# Patient Record
Sex: Female | Born: 2000 | Race: Asian | Hispanic: No | Marital: Single | Smoking: Never smoker
Health system: Southern US, Community
[De-identification: ages and names within clinical notes are randomized; demographics above are authoritative.]

## PROBLEM LIST (undated history)

## (undated) DIAGNOSIS — J45909 Unspecified asthma, uncomplicated: Secondary | ICD-10-CM

## (undated) DIAGNOSIS — E049 Nontoxic goiter, unspecified: Secondary | ICD-10-CM

## (undated) DIAGNOSIS — L309 Dermatitis, unspecified: Secondary | ICD-10-CM

## (undated) DIAGNOSIS — R6250 Unspecified lack of expected normal physiological development in childhood: Secondary | ICD-10-CM

## (undated) DIAGNOSIS — E063 Autoimmune thyroiditis: Secondary | ICD-10-CM

## (undated) DIAGNOSIS — E3431 Constitutional short stature: Secondary | ICD-10-CM

## (undated) HISTORY — DX: Nontoxic goiter, unspecified: E04.9

## (undated) HISTORY — PX: NO PAST SURGERIES: SHX2092

## (undated) HISTORY — DX: Autoimmune thyroiditis: E06.3

## (undated) HISTORY — DX: Dermatitis, unspecified: L30.9

## (undated) HISTORY — DX: Unspecified asthma, uncomplicated: J45.909

## (undated) HISTORY — DX: Unspecified lack of expected normal physiological development in childhood: R62.50

## (undated) HISTORY — DX: Constitutional short stature: E34.31

---
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
## 2019-06-17 ENCOUNTER — Other Ambulatory Visit: Payer: Self-pay

## 2019-06-17 DIAGNOSIS — Z20822 Contact with and (suspected) exposure to covid-19: Secondary | ICD-10-CM

## 2019-06-18 LAB — NOVEL CORONAVIRUS, NAA: SARS-CoV-2, NAA: NOT DETECTED

## 2019-06-30 ENCOUNTER — Ambulatory Visit
Admission: RE | Admit: 2019-06-30 | Discharge: 2019-06-30 | Disposition: A | Discharge status: Home / Self Care | Payer: BC Managed Care – PPO | Source: Ambulatory Visit | Attending: Allergy and Immunology | Admitting: Allergy and Immunology

## 2019-06-30 ENCOUNTER — Other Ambulatory Visit: Payer: Self-pay | Admitting: Allergy and Immunology

## 2019-06-30 DIAGNOSIS — J45909 Unspecified asthma, uncomplicated: Secondary | ICD-10-CM

## 2019-07-03 ENCOUNTER — Other Ambulatory Visit: Payer: Self-pay | Admitting: Family Medicine

## 2019-07-03 DIAGNOSIS — K5909 Other constipation: Secondary | ICD-10-CM

## 2019-07-03 DIAGNOSIS — R109 Unspecified abdominal pain: Secondary | ICD-10-CM

## 2019-07-03 DIAGNOSIS — E611 Iron deficiency: Secondary | ICD-10-CM

## 2020-06-23 ENCOUNTER — Ambulatory Visit
Admission: RE | Admit: 2020-06-23 | Discharge: 2020-06-23 | Disposition: A | Discharge status: Home / Self Care | Payer: BC Managed Care – PPO | Source: Ambulatory Visit | Attending: Family Medicine | Admitting: Family Medicine

## 2020-06-23 ENCOUNTER — Other Ambulatory Visit: Payer: Self-pay | Admitting: Family Medicine

## 2020-06-23 DIAGNOSIS — N390 Urinary tract infection, site not specified: Secondary | ICD-10-CM

## 2021-10-15 ENCOUNTER — Emergency Department
Admission: EM | Admit: 2021-10-15 | Discharge: 2021-10-15 | Disposition: A | Discharge status: Home / Self Care | Payer: BC Managed Care – PPO | Attending: Emergency Medicine | Admitting: Emergency Medicine

## 2021-10-15 DIAGNOSIS — Y906 Blood alcohol level of 120-199 mg/100 ml: Secondary | ICD-10-CM | POA: Insufficient documentation

## 2021-10-15 DIAGNOSIS — F10920 Alcohol use, unspecified with intoxication, uncomplicated: Secondary | ICD-10-CM | POA: Diagnosis present

## 2021-10-15 LAB — CBC, PLATELET & DIFFERENTIAL
ABSOLUTE BASO COUNT: 0 10*3/uL (ref 0.0–0.1)
ABSOLUTE EOSINOPHIL COUNT: 0.2 10*3/uL (ref 0.0–0.8)
ABSOLUTE IMM GRAN COUNT: 0.03 10*3/uL (ref 0.00–0.10)
ABSOLUTE LYMPH COUNT: 3.6 10*3/uL (ref 0.6–5.9)
ABSOLUTE MONO COUNT: 0.4 10*3/uL (ref 0.2–1.4)
ABSOLUTE NEUTROPHIL COUNT: 3.5 10*3/uL (ref 1.6–8.3)
ABSOLUTE NRBC COUNT: 0 10*3/uL (ref 0.0–0.0)
BASOPHIL %: 0.5 % (ref 0.0–1.2)
EOSINOPHIL %: 1.9 % (ref 0.0–7.0)
HEMATOCRIT: 44.7 % (ref 34.1–44.9)
HEMOGLOBIN: 14.7 g/dL (ref 11.2–15.7)
IMMATURE GRANULOCYTE %: 0.4 % (ref 0.0–1.0)
LYMPHOCYTE %: 46.4 % (ref 15.0–54.0)
MEAN CORP HGB CONC: 32.9 g/dL (ref 31.0–37.0)
MEAN CORPUSCULAR HGB: 27.7 pg (ref 26.0–34.0)
MEAN CORPUSCULAR VOL: 84.3 fl (ref 80.0–100.0)
MEAN PLATELET VOLUME: 9.5 fL (ref 8.7–12.5)
MONOCYTE %: 5.2 % (ref 4.0–13.0)
NEUTROPHIL %: 45.6 % (ref 40.0–75.0)
NRBC %: 0 % (ref 0.0–0.0)
PLATELET COUNT: 416 10*3/uL — ABNORMAL HIGH (ref 150–400)
RBC DISTRIBUTION WIDTH STD DEV: 35.8 fL (ref 35.1–46.3)
RED BLOOD CELL COUNT: 5.3 M/uL — ABNORMAL HIGH (ref 3.90–5.20)
WHITE BLOOD CELL COUNT: 7.7 10*3/uL (ref 4.0–11.0)

## 2021-10-15 LAB — BASIC METABOLIC PANEL
ANION GAP: 19 mmol/L (ref 10–22)
BUN (UREA NITROGEN): 4 mg/dL — ABNORMAL LOW (ref 7–18)
CALCIUM: 9.5 mg/dL (ref 8.5–10.5)
CARBON DIOXIDE: 19 mmol/L — ABNORMAL LOW (ref 21–32)
CHLORIDE: 102 mmol/L (ref 98–107)
CREATININE: 0.6 mg/dL (ref 0.4–1.2)
ESTIMATED GLOMERULAR FILT RATE: >60 mL/min (ref >60)
Glucose Random: 118 mg/dL (ref 74–160)
POTASSIUM: 4.3 mmol/L (ref 3.5–5.1)
SODIUM: 140 mmol/L (ref 136–145)

## 2021-10-15 LAB — BLOOD SUGAR FINGERSTICK (POINT OF CARE): FINGERSTICK GLUCOSE: 102 mg/dl (ref 74–160)

## 2021-10-15 LAB — SERUM DRUG SCREEN
ACETAMINOPHEN: <5 ug/mL (ref 10–30)
ETHANOL: 146 mg/dL — ABNORMAL HIGH (ref 0–10)
SALICYLATE: <0.5 mg/dL (ref 3.0–20.0)

## 2021-10-15 MED ORDER — SODIUM CHLORIDE 0.9 % IV BOLUS
1000.0000 mL | Freq: Once | INTRAVENOUS | Status: AC
  Administered 2021-10-15: 1000 mL via INTRAVENOUS

## 2021-10-15 MED ORDER — ONDANSETRON HCL 4 MG/2ML IJ SOLN
4.00 mg | Freq: Once | INTRAMUSCULAR | Status: AC
Start: 2021-10-15 — End: 2021-10-15
  Administered 2021-10-15: 4 mg via INTRAVENOUS
  Filled 2021-10-15: qty 2

## 2021-10-15 NOTE — ED Provider Notes (Signed)
The patient was seen primarily by me. ED nursing record was reviewed. Select prior records as available electronically through the Epic record were reviewed.     HPI:    Shelby Ferrell is a 20 year old female patient who presented acutely intoxicated from the community.  Per report, her friends called because she was intoxicated and was nauseous.  The patient vomiting upon presentation.  No fever, no chills.  No abdominal pain.  No diarrhea.  No chest pain, cough or shortness of breath.  Patient reports that she does not drink often, and reports that she drank 4 drinks tonight.  She denies recreational drug use. No fall.     Triage Documentation     Evette Georges, RN 10/15/2021 01:12             Pt BIB[facility] ETOH from [facility]. Pt sleeping, chest rise and fall noted, pt vss. Friends called bc pt was etoh, mixed drinks.            ROS: Pertinent positives were reviewed as per the HPI above. All other systems were reviewed and are negative.  Magnus Ivan  Language of care: Daisy Floro  MRN: [id number]  PCP: No primary care provider on file.  Mode of arrival to ED: Ambulance Pro.  Chief complaint: Alcohol Problem    Past Medical History/Problem list:  No past medical history on file.  There is no problem list on file for this patient.    Past Surgical History: No past surgical history on file.  Social History:   Social History     Socioeconomic History    Marital status: Not on file     Spouse name: Not on file    Number of children: Not on file    Years of education: Not on file    Highest education level: Not on file   Occupational History    Not on file   Tobacco Use    Smoking status: Not on file    Smokeless tobacco: Not on file   Substance and Sexual Activity    Alcohol use: Not on file    Drug use: Not on file    Sexual activity: Not on file   Other Topics Concern    Not on file   Social History Narrative    Not on file   Social Determinants of Health  Financial Resource Strain: Not on file  Food  Insecurity: Not on file  Transportation Needs: Not on file  Physical Activity: Not on file  Stress: Not on file  Social Connections: Not on file  Intimate Partner Violence: Not on file  Housing Stability: Not on file     Allergies: Review of Patient's Allergies indicates:  Not on File    Immunizations:   There is no immunization history on file for this patient.       Medications:  None     Physical Exam (ED Bed HAL 08/HAL 08-A):   Patient Vitals for the past 999 hrs:   BP Temp Pulse Resp SpO2   10/15/21 0109 95/61 97.7 F 75 16 98 %     GENERAL:  WDWN, no acute distress, non-toxic   SKIN:  Warm & Dry, no rash, no petechiae or purpurae.  HEAD:  NCAT. Sclerae are anicteric and aninjected, oropharynx is clear with moist mucous membranes. PERRL.  NECK:  Supple, no LAN.  LUNGS:  Clear to auscultation bilaterally. No wheezes, rales, rhonchi.   HEART:  RRR.  No murmurs, rubs,  or gallops.   ABDOMEN:  Soft, NTND.  No involuntary guarding or rebound.   EXTREMITIES:  No obvious deformities.  GENITOURINARY:  No CVA tenderness B.  NEUROLOGIC: Slurred speech, moving all extremity, following simple commands  PSYCHIATRIC: Intoxicated affect    Medications Given in the ED:    Medications   ondansetron (ZOFRAN) injection 4 mg (4 mg Intravenous Given 10/15/21 0148)   sodium chloride 0.9 % IV bolus 1,000 mL (0 mLs Intravenous Stopped 10/15/21 0425)    Radiology Results:  See ED COURSE   Lab Results:     Labs Reviewed   CBC, PLATELET & DIFFERENTIAL - Abnormal; Notable for the following components:       Result Value    RED BLOOD CELL COUNT 5.30 (*)     PLATELET COUNT 416 (*)     All other components within normal limits   BASIC METABOLIC PANEL - Abnormal; Notable for the following components:    CARBON DIOXIDE 19 (*)     BUN (UREA NITROGEN) 4 (*)     All other components within normal limits    Narrative:     Tests added: SDS by [facility] on 10/15/21 at 0227 by PV12.   SERUM DRUG SCREEN - Abnormal; Notable for the following components:     ETHANOL 146 (*)     All other components within normal limits    Narrative:     Tests added: SDS by [facility] on 10/15/21 at 0227 by PV12.   LAB ADD ON/WRITE IN TEST   BLOOD SUGAR FINGERSTICK (POINT OF CARE)   SERUM DRUG SCREEN        No image results found.      Other Results and OLD/PRIOR records information and data (e.g. ECG, visual acuity):  See ED COURSE     ED Course and Medical Decision-making:  20 year old female presenting acutely intoxicated from the community.  Upon presentation, patient is actively vomiting and appears intoxicated.  Fingerstick blood sugar within normal limit.  Due to patient still currently vomiting, will administer IV fluid, Zofran.  Will order basic blood work, monitor and reassess    ED Course as of 10/17/21 2115   Sat Oct 15, 2021   0539 Awake and alert. Tolerating p.o.  Will discharge.  Patient will call an Uber home   [phone] Resting, no distress   0304 Resting, no distress   0254 Etoh 146  BMP with no AKI.  No anemia.  Normal white cell count     Patient was observed for several hours with improvement of mental status, now awake and alert.  The patient is tolerating p.o.  Advised to rest, drink plenty of fluid, and follow-up with PCP in a few days.  Patient will call a uber home    Patient educated on diagnosis(es); she states understanding and agreement with plan of care.  Reasons to return to the ED were reviewed in detail. She agrees with this plan and disposition.    Disposition: Discharge    Condition on Discharge:  Stable    Diagnosis/Diagnoses:  Alcoholic intoxication without complication ([place])    Maude Leriche, MD MPH  Attending physician  Emergency [place]    This Emergency Department patient encounter note was created using voice-recognition software and in real time during the ED visit.

## 2021-10-15 NOTE — ED Triage Note (Signed)
Pt BIB[facility] ETOH from [facility]. Pt sleeping, chest rise and fall noted, pt vss. Friends called bc pt was etoh, mixed drinks.

## 2021-10-15 NOTE — Discharge Instructions (Addendum)
You were seen in the emergency department today after you were intoxicated.  Your alcohol level was 146  You received IV fluid and Zofran.  Drink plenty of fluid today  Rest  Follow-up with your doctor next week  Come back to the emergency department if you do not feel well, if you cannot keep liquids or food down, or in case of any new or worrisome symptoms

## 2021-10-15 NOTE — Narrator Note (Signed)
Patient Disposition  Patient education for diagnosis, medications, activity, diet and follow-up.  Patient left ED 6:15 AM.  Patient rep received written instructions.    Interpreter to provide instructions: No    Patient belongings with patient: YES    Have all existing LDAs been addressed? Yes    Have all IV infusions been stopped? yes    Destination: Discharged to home, instructions reviewed, verbal understanding, pt left with steady gait

## 2021-10-15 NOTE — ED Notes (Addendum)
Shelby Ferrell - Shelby Ferrell     [phone]

## 2022-01-31 IMAGING — CT CT ABD-PELV W/O CM
2 of 4 series · 12 of 46 positions shown, 14 images · non-contrast
Comparison: None.

CLINICAL DATA: Recurrent urinary tract infections. Urinary
frequency.

EXAM:
CT ABDOMEN AND PELVIS WITHOUT CONTRAST
TECHNIQUE: Multidetector CT imaging of the abdomen and pelvis was performed
following the standard protocol without IV contrast.

[Series 2: renal stone 5.00 br40 s3 axial · axial · 0.44mm/px · z∈[+1322,+1657]mm · 9 of 81 slices shown, 11 images]
[im 7/81  soft-tissue]
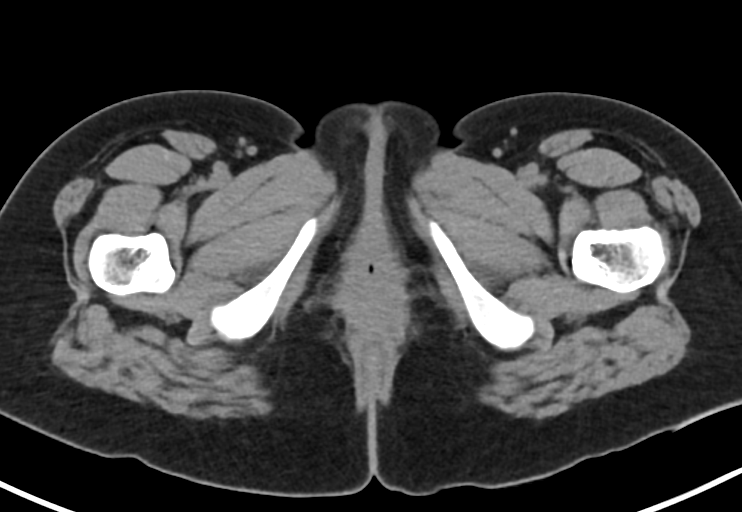
[im 7/81  bone]
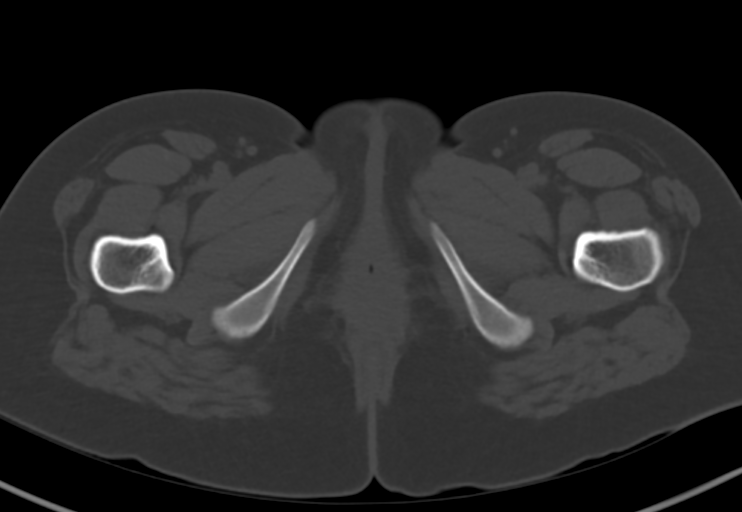
[im 14/81  soft-tissue]
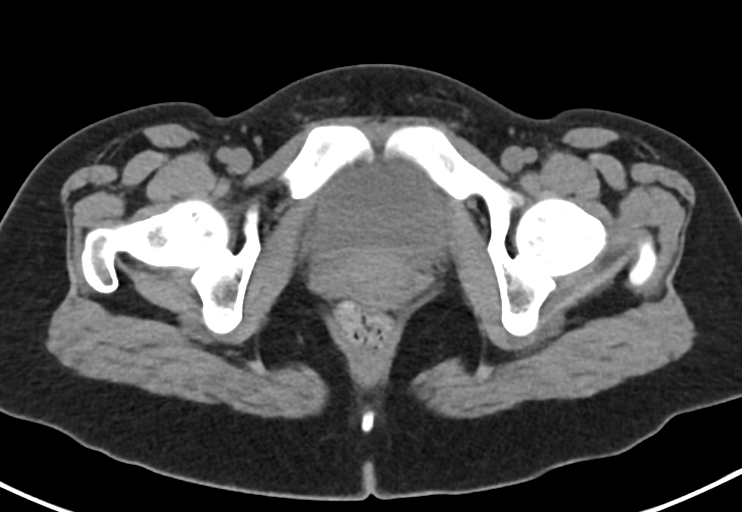
[im 24/81  soft-tissue]
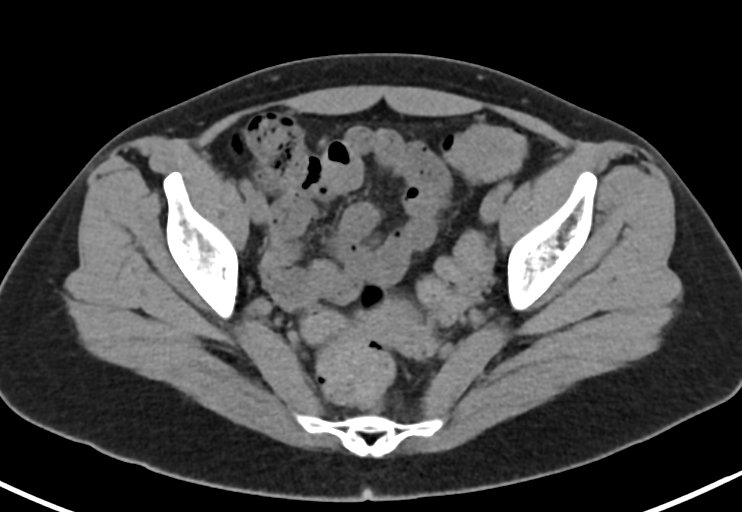
[im 31/81  soft-tissue]
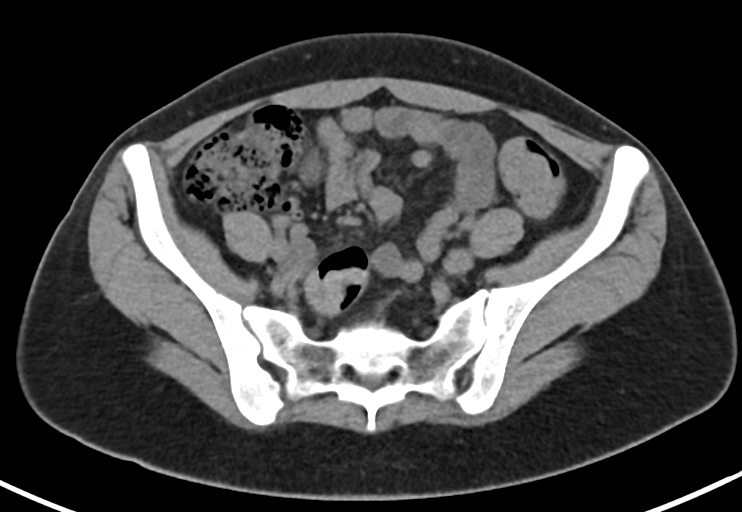
[im 41/81  soft-tissue]
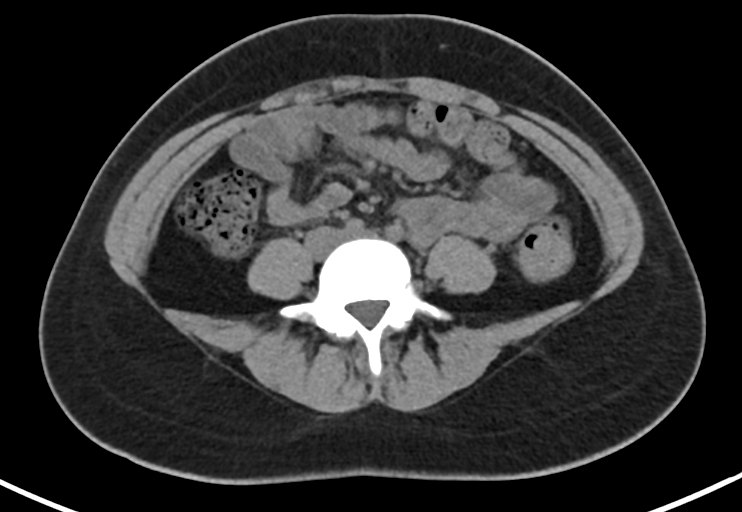
[im 51/81  soft-tissue]
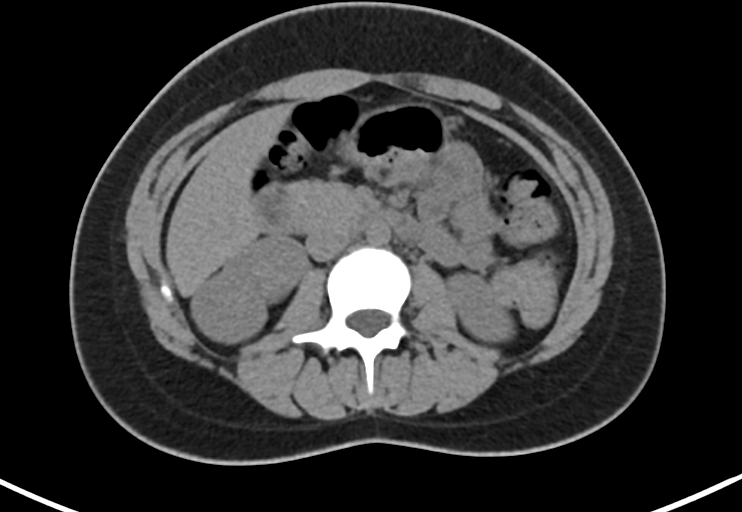
[im 57/81  soft-tissue]
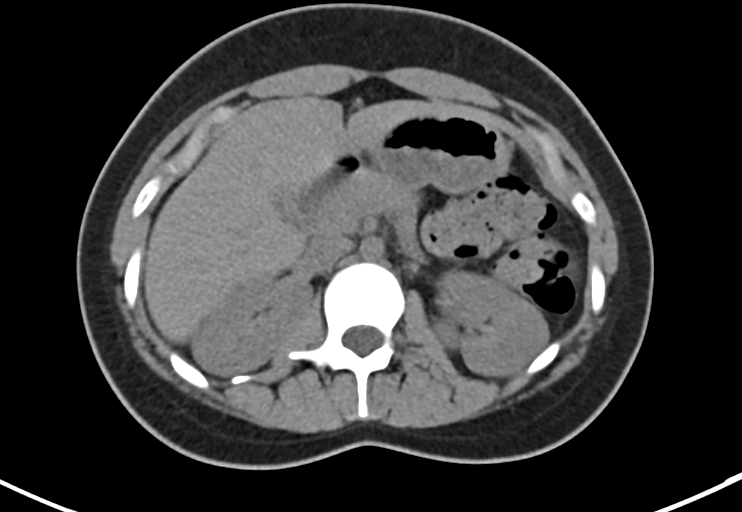
[im 67/81  soft-tissue]
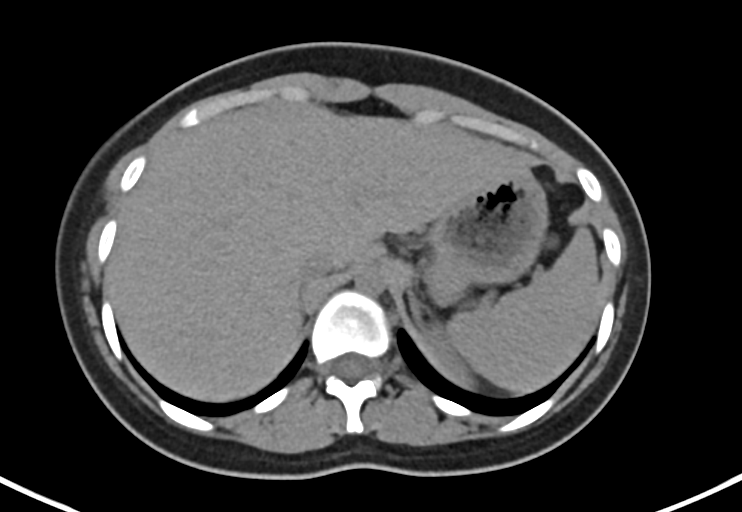
[im 74/81  soft-tissue]
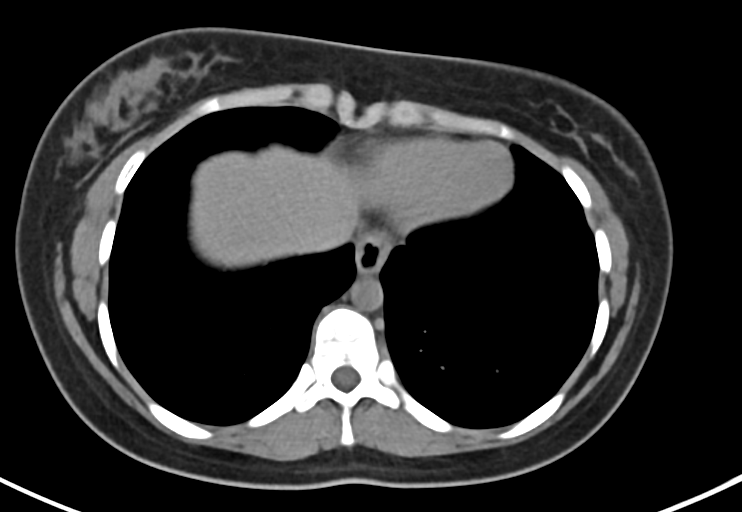
[im 74/81  bone]
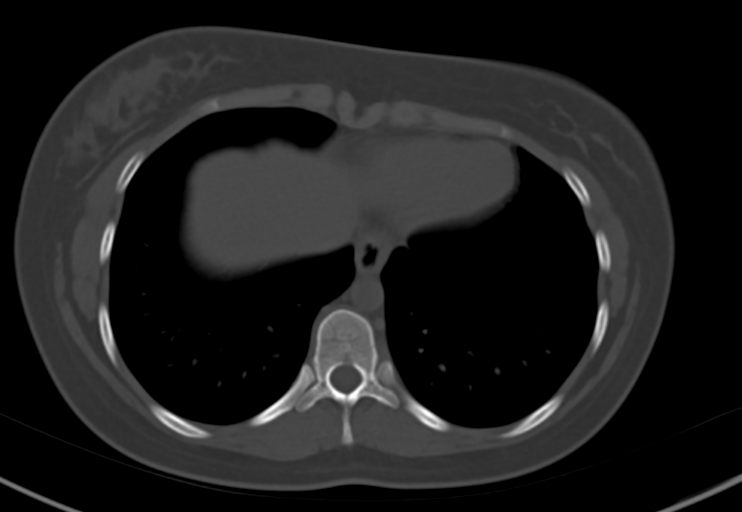

[Series 6: renal stone 2.00 br40 s3 cor · coronal · 0.64mm/px · 3 of 113 slices shown]
[im 38/113  soft-tissue]
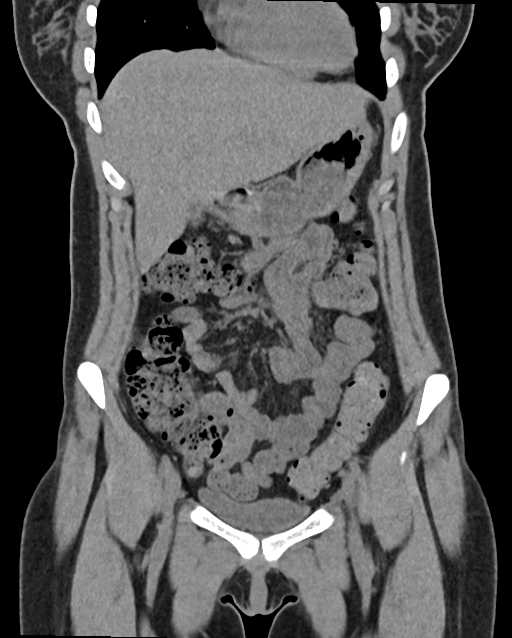
[im 50/113  soft-tissue]
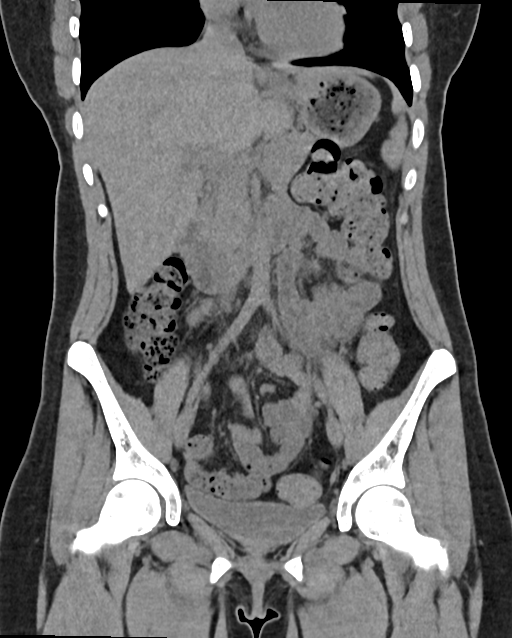
[im 63/113  soft-tissue]
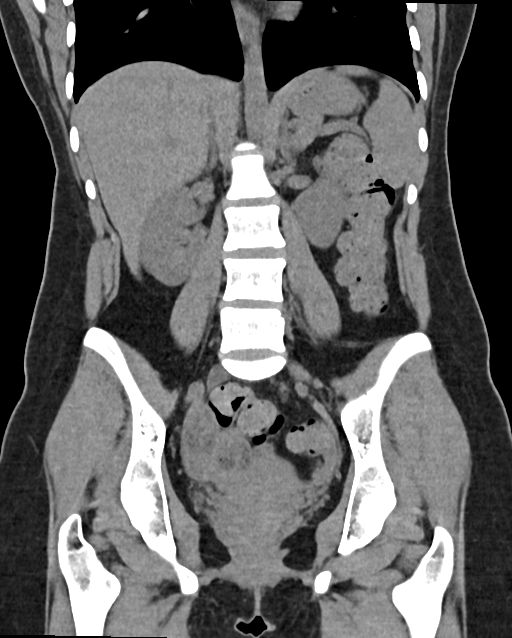

[12 of 46 positions shown; findings below may reference images not displayed]

FINDINGS: Lower chest: No acute findings.

Hepatobiliary: No mass visualized on this unenhanced exam. Focal
fatty infiltration seen adjacent to the falciform ligament.
Gallbladder is nearly empty. No evidence of biliary obstruction.

Pancreas: No mass or inflammatory process visualized on this
unenhanced exam.

Spleen:  Within normal limits in size.

Adrenals/Urinary tract: No evidence of urolithiasis or
hydronephrosis. Unremarkable unopacified urinary bladder.

Stomach/Bowel: No evidence of obstruction, inflammatory process, or
abnormal fluid collections. Normal appendix visualized.

Vascular/Lymphatic: No pathologically enlarged lymph nodes
identified. No evidence of abdominal aortic aneurysm.

Reproductive:  No mass or other significant abnormality.

Other:  None.

Musculoskeletal:  No suspicious bone lesions identified.
IMPRESSION: No evidence of urolithiasis, hydronephrosis, or other acute
findings.

## 2022-09-05 ENCOUNTER — Ambulatory Visit: Payer: BC Managed Care – PPO | Admitting: Family Medicine

## 2022-09-05 ENCOUNTER — Ambulatory Visit (INDEPENDENT_AMBULATORY_CARE_PROVIDER_SITE_OTHER)
Admission: RE | Admit: 2022-09-05 | Discharge: 2022-09-05 | Disposition: A | Discharge status: Home / Self Care | Payer: BC Managed Care – PPO | Source: Ambulatory Visit | Attending: Family Medicine | Admitting: Family Medicine

## 2022-09-05 ENCOUNTER — Encounter: Payer: Self-pay | Admitting: Family Medicine

## 2022-09-05 VITALS — BP 116/80 | HR 82 | Temp 98.7°F | Ht <= 58 in | Wt 109.0 lb

## 2022-09-05 DIAGNOSIS — R1013 Epigastric pain: Secondary | ICD-10-CM

## 2022-09-05 DIAGNOSIS — R0789 Other chest pain: Secondary | ICD-10-CM

## 2022-09-05 LAB — H. PYLORI ANTIBODY, IGG: H Pylori IgG: NEGATIVE

## 2022-09-05 LAB — AMYLASE: Amylase: 100 U/L (ref 27–131)

## 2022-09-05 LAB — LIPASE: Lipase: 12 U/L (ref 11.0–59.0)

## 2022-09-05 MED ORDER — OMEPRAZOLE 40 MG PO CPDR: 40.0000 mg | DELAYED_RELEASE_CAPSULE | Freq: Every day | ORAL | 3 refills | Status: DC

## 2022-09-05 NOTE — Progress Notes (Signed)
New Patient Office Visit  Subjective:  Patient ID: Tracy Hardin, female    DOB: 2001-09-29  Age: 21 y.o. MRN: [id number]  CC:  Chief Complaint  Patient presents with   Establish Care    Here for a second opinion on chest discomfort that has been going on for 2 months without relief     HPI Tanayah Squitieri presents for new pt Has PCP.  Not sure if xfer care but wants another opinion on cp for 2 mo. 1  Asthma-seasonally.  And EIA.  No flovent since Spring.   2.  About 1-2 months ago.  Some CP to touch. Near sternum.  Saw [facility] physician.  Costochondritis and Aleve and no change, then muscle relaxor and no change.  Then told anxiety.  Ant chest to LUQ and L shoulder.  Not in back.  Occ collar bone.  No injury.  Always there but can get worse-no specific movement.  Hard to sleep.  No insp pain/no sob. No f/c.  Walking doesn't make worse  EKG-normal per pt.   Will walk, get groceries, etc and has to nap after.  Past 3-4 mo. Labs 3-4 wks ago.  Vit D and B12 low-taking otc B12 and Rx D.    Had covid 2 yrs ago.     H/o bulemia for 7-8 yrs.  No vomiting for2 mo  was once/mo 2 mo prior.  Was daily prior to that or occ more.  Occ heartburn.  Wt stable.  Stress makes worse.   No chest x-ray     Saw ortho sev mo ago for neck pain/shoulder pain.  Did PT.    Past Medical History:  Diagnosis Date   Asthma    Constitutional growth delay    Goiter    Hashimoto's thyroiditis     Past Surgical History:  Procedure Laterality Date   NO PAST SURGERIES      Family History  Problem Relation Age of Onset   Hypertension Mother    Diabetes Mother        type 2   Crohn's disease Mother    Thyroid disease Maternal Aunt    Hypertension Maternal Grandmother    Diabetes Maternal Grandmother        type 2    Social History   Socioeconomic History   Marital status: Single    Spouse name: Not on file   Number of children: 0   Years of education: Not on file   Highest education level: Not  on file  Occupational History   Not on file  Tobacco Use   Smoking status: Never   Smokeless tobacco: Never  Vaping Use   Vaping Use: Never used  Substance and Sexual Activity   Alcohol use: Yes   Drug use: Never   Sexual activity: Yes  Other Topics Concern   Not on file  Social History Narrative   Will go to grad school for computer science.   Taking semester off.   Social Determinants of Health   Financial Resource Strain: Not on file  Food Insecurity: Not on file  Transportation Needs: Not on file  Physical Activity: Not on file  Stress: Not on file  Social Connections: Not on file  Intimate Partner Violence: Not on file    ROS  ROS: Gen: no fever, chills  Skin: eczema ENT: no ear pain, ear drainage, nasal congestion, rhinorrhea, sinus pressure, sore throat Eyes: no blurry vision, double vision Resp: no cough, wheeze,SOB CV: no, palpitations, LE  edema,  GU: no dysuria, urgency, frequency, hematuria.  Not SA   stopped ocp in summer.  1st period in Oct    Objective:   Today's Vitals: BP 116/80   Pulse 82   Temp 98.7 F (37.1 C) (Temporal)   Ht 4' 5.39" (1.356 m)   Wt 109 lb (49.4 kg)   LMP 08/28/2022 (Approximate)   SpO2 99%   BMI 26.88 kg/m   Physical Exam  Gen: WDWN NAD HEENT: NCAT, conjunctiva not injected, sclera nonicteric , OP moist, no exudates  NECK:  supple, no thyromegaly, no nodes, no carotid bruits CARDIAC: RRR, S1S2+, no murmur. DP 2+B LUNGS: CTAB. No wheezes ABDOMEN:  BS+, soft, mildly tender mid epi, No HSM, no masses EXT:  no edema MSK: no gross abnormalities. No TTP c spine.  Some muscle tenderness medial border L scapula.  Tender  NEURO: A&O x3.  CN II-XII intact.  PSYCH: normal mood. Good eye contact   Assessment & Plan:   Problem List Items Addressed This Visit   None Visit Diagnoses     Other chest pain    -  Primary   Relevant Orders   DG Chest 2 View   Midepigastric pain       Relevant Orders   Amylase   Lipase    H. pylori antibody, IgG      Chest wall pain-reviewed labs on phone.  Crp,esr,tsh,cmp,cbc all wnl.   Will check cxr.  ?d/t long h/o vomiting.  Will start omeprazole 74m daily and f/u 1 mo Midepi pain-check h pylori, amylase/lipase.  Start omeprazole 412mdaily.  F/u 1 mo  Outpatient Encounter Medications as of 09/05/2022  Medication Sig   cyclobenzaprine (FLEXERIL) 10 MG tablet Take 10 mg by mouth 2 (two) times daily as needed.   fluticasone (FLOVENT HFA) 110 MCG/ACT inhaler Inhale 1 puff into the lungs as needed.   loteprednol (LOTEMAX) 0.5 % ophthalmic suspension 1 drop as needed.   omeprazole (PRILOSEC) 40 MG capsule Take 1 capsule (40 mg total) by mouth daily.   triamcinolone cream (KENALOG) 0.5 % APPLY TO AFFECTED AREA(S) TWICE DAILY AS NEEDED   Vitamin D, Ergocalciferol, (DRISDOL) 1.25 MG (50000 UNIT) CAPS capsule Take 50,000 Units by mouth every 7 (seven) days.   [DISCONTINUED] albuterol (PROVENTIL) (2.5 MG/3ML) 0.083% nebulizer solution Take 2.5 mg by nebulization every 6 (six) hours as needed.   [DISCONTINUED] Cetirizine HCl (ZYRTEC PO) Take by mouth as needed.     No facility-administered encounter medications on file as of 09/05/2022.    Follow-up: Return in about 4 weeks (around 10/03/2022) for chest pain.   AnWellington HampshireMD

## 2022-09-05 NOTE — Patient Instructions (Signed)
Welcome to [facility]! It was a pleasure meeting you today.  As discussed, take omeprazole daily  get X-ray/labs at [facility].  [place]  hours 8=M-F 8:30-5.  closed 12:30-1 lunch   PLEASE NOTE:  If you had any LAB tests please let us know if you have not heard back within a few days. You may see your results on MyChart before we have a chance to review them but we will give you a call once they are reviewed by [place]. If we ordered any REFERRALS today, please let us know if you have not heard from their office within the next week.  Let us know through MyChart if you are needing REFILLS, or have your pharmacy send [place] the request. You can also use MyChart to communicate with me or any office staff.  Please try these tips to maintain a healthy lifestyle:  Eat most of your calories during the day when you are active. Eliminate processed foods including packaged sweets (pies, cakes, cookies), reduce intake of potatoes, white bread, white pasta, and white rice. Look for whole grain options, oat flour or almond flour.  Each meal should contain half fruits/vegetables, one quarter protein, and one quarter carbs (no bigger than a computer mouse).  Cut down on sweet beverages. This includes juice, soda, and sweet tea. Also watch fruit intake, though this is a healthier sweet option, it still contains natural sugar! Limit to 3 servings daily.  Drink at least 1 glass of water with each meal and aim for at least 8 glasses per day  Exercise at least 150 minutes every week.

## 2022-09-12 ENCOUNTER — Encounter: Payer: Self-pay | Admitting: Family Medicine

## 2022-09-13 ENCOUNTER — Other Ambulatory Visit: Payer: Self-pay | Admitting: *Deleted

## 2022-09-13 DIAGNOSIS — R1010 Upper abdominal pain, unspecified: Secondary | ICD-10-CM

## 2022-09-13 MED ORDER — PREDNISONE 20 MG PO TABS: 20.0000 mg | ORAL_TABLET | Freq: Every day | ORAL | 0 refills | Status: DC

## 2022-10-02 ENCOUNTER — Ambulatory Visit
Admission: RE | Admit: 2022-10-02 | Discharge: 2022-10-02 | Disposition: A | Discharge status: Home / Self Care | Payer: BC Managed Care – PPO | Source: Ambulatory Visit | Attending: Family Medicine | Admitting: Family Medicine

## 2022-10-02 DIAGNOSIS — R1010 Upper abdominal pain, unspecified: Secondary | ICD-10-CM

## 2022-10-03 ENCOUNTER — Ambulatory Visit (INDEPENDENT_AMBULATORY_CARE_PROVIDER_SITE_OTHER): Payer: BC Managed Care – PPO | Admitting: Family Medicine

## 2022-10-03 ENCOUNTER — Encounter: Payer: Self-pay | Admitting: Family Medicine

## 2022-10-03 VITALS — BP 110/70 | HR 93 | Temp 97.9°F | Ht 58.66 in | Wt 111.1 lb

## 2022-10-03 DIAGNOSIS — G44229 Chronic tension-type headache, not intractable: Secondary | ICD-10-CM

## 2022-10-03 DIAGNOSIS — R0789 Other chest pain: Secondary | ICD-10-CM | POA: Diagnosis not present

## 2022-10-03 NOTE — Progress Notes (Signed)
Subjective:     Patient ID: Tracy Hardin, female    DOB: 2000-12-16, 21 y.o.   MRN: [id number]  Chief Complaint  Patient presents with   Follow-up    4 week follow-up on chest pain, went away after using prednisone, came back last Thursday after eating some [place] food    HPI Chest pain-u/s abd negative. Was better after prednisone.  1 wk ago-was vomiting after eating [place] food-vomited sev times-even some blood at end-bright red..  Pain came back and more severe.  Took some aleve yesterday and helped some. Still there though.   Still getting HA-getting for years.  Riding in car can trigger-no nausea. Pain around eyes. Will lie down and resolves in 1 hr but will resolve until goes in car again. Worse if not sleep well.  Eats regularly so doesn't affect. Not worse w/menses.  Mostly can ignore them. No dbl vision.  Occ dark spots when stands up.   Health Maintenance Due  Topic Date Due   HIV Screening  Never done   Hepatitis C Screening  Never done   PAP-Cervical Cytology Screening  Never done   PAP SMEAR-Modifier  Never done    Past Medical History:  Diagnosis Date   Asthma    Constitutional growth delay    Eczema    Goiter    Hashimoto's thyroiditis     Past Surgical History:  Procedure Laterality Date   NO PAST SURGERIES      Outpatient Medications Prior to Visit  Medication Sig Dispense Refill   cyclobenzaprine (FLEXERIL) 10 MG tablet Take 10 mg by mouth 2 (two) times daily as needed.     fexofenadine (ALLEGRA) 180 MG tablet Take by mouth.     fluticasone (FLOVENT HFA) 110 MCG/ACT inhaler Inhale 1 puff into the lungs as needed.     loteprednol (LOTEMAX) 0.5 % ophthalmic suspension 1 drop as needed.     naproxen sodium (ALEVE) 220 MG tablet Take by mouth.     omeprazole (PRILOSEC) 40 MG capsule Take 1 capsule (40 mg total) by mouth daily. 30 capsule 3   triamcinolone cream (KENALOG) 0.5 % APPLY TO AFFECTED AREA(S) TWICE DAILY AS NEEDED     Vitamin D, Ergocalciferol,  (DRISDOL) 1.25 MG (50000 UNIT) CAPS capsule Take 50,000 Units by mouth every 7 (seven) days.     No facility-administered medications prior to visit.    Allergies  Allergen Reactions   Black Walnut Pollen Allergy Skin Test Other (See Comments)   Pecan Nut (Diagnostic) Other (See Comments)   ROS neg/noncontributory except as noted HPI/below      Objective:     BP 110/70   Pulse 93   Temp 97.9 F (36.6 C) (Temporal)   Ht 4' 10.66" (1.49 m)   Wt 111 lb 2 oz (50.4 kg)   LMP 09/17/2022 (Exact Date)   SpO2 99%   BMI 22.70 kg/m  Wt Readings from Last 3 Encounters:  10/03/22 111 lb 2 oz (50.4 kg)  09/05/22 109 lb (49.4 kg)  11/23/11 78 lb (35.4 kg) (21 %, Z= -0.79)*   * Growth percentiles are based on CDC (Girls, 2-20 Years) data.    Physical Exam   Gen: WDWN NAD HEENT: NCAT, conjunctiva not injected, sclera nonicteric NECK:  supple, no thyromegaly, no nodes, no carotid bruits CARDIAC: RRR, S1S2+, no murmur. DP 2+B LUNGS: CTAB. No wheezes ABDOMEN:  BS+, soft, NTND, No HSM, no masses EXT:  no edema MSK: tender chest wall to palp L>R  NEURO: A&O x3.  CN II-XII intact.  PSYCH: normal mood. Good eye contact  Discussed u//s abn normal     Assessment & Plan:   Problem List Items Addressed This Visit   None Visit Diagnoses     Other chest pain    -  Primary   Chronic tension-type headache, not intractable       Relevant Medications   naproxen sodium (ALEVE) 220 MG tablet      Chest pain-prob musculoskeletal.  Abd u/s neg,  w/u neg.  Was improved but exacerbated after vomiting episode.  Aleve bid.  If worse, not improving by end of wk, will do pred 40mg  daily x5d.   HA-triggered by riding in car, but not really plane/subway.  Try eye mask if passenger to see if from visual input.  Trial daily Mg and tumeric.  If not helping, consider nortriptylline.  F/u March. Can do virtual visits if needed while at college.    No orders of the defined types were placed in this  encounter.   April, MD

## 2022-10-03 NOTE — Patient Instructions (Addendum)
It was very nice to see you today!  Magnesium 250-400mg  daily. Tumeric daily.  Electrolytes to 1 water/day. Drink plenty of water.   If chest pain still bad on Friday, let me know and will do prednisone.   Aleve twice/day for now.   PLEASE NOTE:  If you had any lab tests please let us know if you have not heard back within a few days. You may see your results on MyChart before we have a chance to review them but we will give you a call once they are reviewed by [place]. If we ordered any referrals today, please let us know if you have not heard from their office within the next week.   Please try these tips to maintain a healthy lifestyle:  Eat most of your calories during the day when you are active. Eliminate processed foods including packaged sweets (pies, cakes, cookies), reduce intake of potatoes, white bread, white pasta, and white rice. Look for whole grain options, oat flour or almond flour.  Each meal should contain half fruits/vegetables, one quarter protein, and one quarter carbs (no bigger than a computer mouse).  Cut down on sweet beverages. This includes juice, soda, and sweet tea. Also watch fruit intake, though this is a healthier sweet option, it still contains natural sugar! Limit to 3 servings daily.  Drink at least 1 glass of water with each meal and aim for at least 8 glasses per day  Exercise at least 150 minutes every week.

## 2022-10-05 ENCOUNTER — Other Ambulatory Visit: Payer: Self-pay | Admitting: Family Medicine

## 2022-10-05 ENCOUNTER — Telehealth: Payer: Self-pay | Admitting: Family Medicine

## 2022-10-05 DIAGNOSIS — R1010 Upper abdominal pain, unspecified: Secondary | ICD-10-CM

## 2022-10-05 MED ORDER — PREDNISONE 20 MG PO TABS: 20.0000 mg | ORAL_TABLET | Freq: Every day | ORAL | 0 refills | Status: AC

## 2022-10-05 NOTE — Telephone Encounter (Signed)
Noted  

## 2022-10-05 NOTE — Telephone Encounter (Signed)
Please see message below and advise.

## 2022-10-05 NOTE — Telephone Encounter (Signed)
Pt states: -Recently seen by PCP on 12/05 and discussed possible need for prednisone. -Was instructed to alert PCP team if she was not feeling better. -She is leaving for 3 week international travel tomorrow 10/06/22, and will not be able to get Rx after she leaves.   Pt Requests: -PCP Team to Send Rx to pharmacy.  [id number] - [place], [place] - [address] AT [facility] [address], [place] [address] Phone: [phone]  Fax: [phone] DEA #: [id number]

## 2022-12-01 ENCOUNTER — Other Ambulatory Visit: Payer: Self-pay | Admitting: Family Medicine

## 2023-01-23 ENCOUNTER — Ambulatory Visit (INDEPENDENT_AMBULATORY_CARE_PROVIDER_SITE_OTHER): Payer: BC Managed Care – PPO | Admitting: Family Medicine

## 2023-01-23 ENCOUNTER — Encounter: Payer: Self-pay | Admitting: Family Medicine

## 2023-01-23 VITALS — BP 100/60 | HR 72 | Temp 98.2°F | Ht 58.66 in | Wt 115.2 lb

## 2023-01-23 DIAGNOSIS — G44229 Chronic tension-type headache, not intractable: Secondary | ICD-10-CM

## 2023-01-23 DIAGNOSIS — R0789 Other chest pain: Secondary | ICD-10-CM

## 2023-01-23 DIAGNOSIS — E559 Vitamin D deficiency, unspecified: Secondary | ICD-10-CM | POA: Insufficient documentation

## 2023-01-23 DIAGNOSIS — E538 Deficiency of other specified B group vitamins: Secondary | ICD-10-CM | POA: Diagnosis not present

## 2023-01-23 DIAGNOSIS — R1010 Upper abdominal pain, unspecified: Secondary | ICD-10-CM

## 2023-01-23 LAB — CBC WITH DIFFERENTIAL/PLATELET
Basophils Absolute: 0 10*3/uL (ref 0.0–0.1)
Basophils Relative: 0.7 % (ref 0.0–3.0)
Eosinophils Absolute: 0.2 10*3/uL (ref 0.0–0.7)
Eosinophils Relative: 2.9 % (ref 0.0–5.0)
HCT: 40 % (ref 36.0–46.0)
Hemoglobin: 13.3 g/dL (ref 12.0–15.0)
Lymphocytes Relative: 44.3 % (ref 12.0–46.0)
Lymphs Abs: 2.4 10*3/uL (ref 0.7–4.0)
MCHC: 33.3 g/dL (ref 30.0–36.0)
MCV: 81.2 fl (ref 78.0–100.0)
Monocytes Absolute: 0.4 10*3/uL (ref 0.1–1.0)
Monocytes Relative: 7.8 % (ref 3.0–12.0)
Neutro Abs: 2.4 10*3/uL (ref 1.4–7.7)
Neutrophils Relative %: 44.3 % (ref 43.0–77.0)
Platelets: 345 10*3/uL (ref 150.0–400.0)
RBC: 4.93 Mil/uL (ref 3.87–5.11)
RDW: 12.9 % (ref 11.5–15.5)
WBC: 5.3 10*3/uL (ref 4.0–10.5)

## 2023-01-23 LAB — COMPREHENSIVE METABOLIC PANEL
ALT: 14 U/L (ref 0–35)
AST: 15 U/L (ref 0–37)
Albumin: 4.5 g/dL (ref 3.5–5.2)
Alkaline Phosphatase: 70 U/L (ref 39–117)
BUN: 6 mg/dL (ref 6–23)
CO2: 29 mEq/L (ref 19–32)
Calcium: 9.8 mg/dL (ref 8.4–10.5)
Chloride: 104 mEq/L (ref 96–112)
Creatinine, Ser: 0.64 mg/dL (ref 0.40–1.20)
GFR: 124.89 mL/min (ref >60.00)
Glucose, Bld: 94 mg/dL (ref 70–99)
Potassium: 4.5 mEq/L (ref 3.5–5.1)
Sodium: 141 mEq/L (ref 135–145)
Total Bilirubin: 0.3 mg/dL (ref 0.2–1.2)
Total Protein: 7.2 g/dL (ref 6.0–8.3)

## 2023-01-23 LAB — VITAMIN D 25 HYDROXY (VIT D DEFICIENCY, FRACTURES): VITD: 20.12 ng/mL — ABNORMAL LOW (ref 30.00–100.00)

## 2023-01-23 LAB — VITAMIN B12: Vitamin B-12: 180 pg/mL — ABNORMAL LOW (ref 211–911)

## 2023-01-23 LAB — TSH: TSH: 1.32 u[IU]/mL (ref 0.35–5.50)

## 2023-01-23 NOTE — Patient Instructions (Signed)
Yoga  See if cycle related.

## 2023-01-23 NOTE — Progress Notes (Signed)
Subjective:     Patient ID: Tracy Hardin, female    DOB: [birth year], 22 y.o.   MRN: [id number]  Chief Complaint  Patient presents with   Follow-up    3 month follow-up Not fasting      HPI  HA-triggered by car riding.  Taking mg and thinks better.  Caffeine and lack of sleep triggers.  CP-has had u/s abd. CXR neg labs unemarkable. Seems more musculoskeletal.  When flares, will take naproxen for 2-3 days and then better.  Seems more when vomits(hasn't done for about 2 months).  Does Dance for exercise. Flares about q 2 wks. Vitamin D and B12 def-taking MVI  There are no preventive care reminders to display for this patient.   Past Medical History:  Diagnosis Date   Asthma    Constitutional growth delay    Eczema    Goiter    Hashimoto's thyroiditis     Past Surgical History:  Procedure Laterality Date   NO PAST SURGERIES      Outpatient Medications Prior to Visit  Medication Sig Dispense Refill   acetaminophen (TYLENOL) 500 MG tablet 2 tabs Orally prn during menstural cycle     fexofenadine (ALLEGRA) 180 MG tablet Take by mouth.     fluticasone (FLOVENT HFA) 110 MCG/ACT inhaler Inhale 1 puff into the lungs as needed.     naproxen sodium (ALEVE) 220 MG tablet Take by mouth as needed.     omeprazole (PRILOSEC) 40 MG capsule TAKE 1 CAPSULE (40 MG TOTAL) BY MOUTH DAILY. 90 capsule 1   tretinoin (RETIN-A) 0.025 % cream SMARTSIG:Sparingly Topical Every Night PRN     triamcinolone cream (KENALOG) 0.5 % APPLY TO AFFECTED AREA(S) TWICE DAILY AS NEEDED     loteprednol (LOTEMAX) 0.5 % ophthalmic suspension 1 drop as needed. (Patient not taking: Reported on 01/23/2023)     cyclobenzaprine (FLEXERIL) 10 MG tablet Take 10 mg by mouth 2 (two) times daily as needed.     naproxen sodium (ALEVE) 220 MG tablet Take by mouth.     Vitamin D, Ergocalciferol, (DRISDOL) 1.25 MG (50000 UNIT) CAPS capsule Take 50,000 Units by mouth every 7 (seven) days.     No facility-administered  medications prior to visit.    Allergies  Allergen Reactions   Black Walnut Pollen Allergy Skin Test Other (See Comments)   Pecan Nut (Diagnostic) Other (See Comments)   Dust Mite Extract     Other Reaction(s): rashes on skin, irritation of eyes and nose   Pollen Extract     Other Reaction(s): irritation of eyes and nose   ROS neg/noncontributory except as noted HPI/below      Objective:     BP 100/60   Pulse 72   Temp 98.2 F (36.8 C) (Temporal)   Ht 4' 10.66" (1.49 m)   Wt 115 lb 4 oz (52.3 kg)   LMP 01/11/2023 (Exact Date)   SpO2 99%   BMI 23.55 kg/m  Wt Readings from Last 3 Encounters:  01/23/23 115 lb 4 oz (52.3 kg)  10/03/22 111 lb 2 oz (50.4 kg)  09/05/22 109 lb (49.4 kg)    Physical Exam   Gen: WDWN NAD HEENT: NCAT, conjunctiva not injected, sclera nonicteric NECK:  supple, no thyromegaly, no nodes, no carotid bruits CARDIAC: RRR, S1S2+, no murmur.  LUNGS: CTAB. No wheezes ABDOMEN:  BS+, soft, NTND, No HSM, no masses EXT:  no edema MSK: no gross abnormalities. Sl tender L chest wall NEURO: A&O x3.  CN II-XII  intact.  PSYCH: normal mood. Good eye contact     Assessment & Plan:   Problem List Items Addressed This Visit       Other   Vitamin D deficiency   Relevant Orders   VITAMIN D 25 Hydroxy (Vit-D Deficiency, Fractures)   Vitamin B12 deficiency   Relevant Orders   Vitamin B12   Other Visit Diagnoses     Upper abdominal pain    -  Primary   Relevant Orders   Comprehensive metabolic panel   TSH   CBC with Differential/Platelet   Other chest pain       Chronic tension-type headache, not intractable       Relevant Medications   naproxen sodium (ALEVE) 220 MG tablet   acetaminophen (TYLENOL) 500 MG tablet      Upper abd pain-better.  Continue omeprazole 40mg  daily and avoid vomiting.  Check cmp,tsh,cbc Chest pain-M-S.  Chronic but improved since not vomiting.  Cont omeprazole, avoiding vomiting, naproxen prn.  Monitor if cyclical as  well ED-pt working on it.  Consider counseling.  Discussed healthy habits and congrats on dec vomiting HA-better.  Keep log Vit d def-only on mvi-check d B12 def-on MVI-check B12  No orders of the defined types were placed in this encounter.   Wellington Hampshire, MD

## 2023-01-28 NOTE — Progress Notes (Signed)
Labs look good except: 1.  Vitamin D is low.  You will need to take an extra supplement of vitamin D-2000 IUs/day 2.  B12 is quite low-need to take additional supplements of B12-suggest 2000 mcg daily. 3.  Need to recheck levels in about 3 months (I cannot remember if we discussed scheduling a follow-up before you go back in the fall, but should)

## 2023-03-02 ENCOUNTER — Telehealth: Payer: Self-pay | Admitting: Family Medicine

## 2023-03-02 NOTE — Telephone Encounter (Signed)
Prescription Request  03/02/2023  LOV: 01/23/2023  What is the name of the medication or equipment? albuterol (PROVENTIL) (2.5 MG/3ML) 0.083% nebulizer solution   fluticasone (FLOVENT HFA) 110 MCG/ACT inhaler   Have you contacted your pharmacy to request a refill? No   Which pharmacy would you like this sent to?  [id number] [place], [place] - [address] [place], [place] [address] Phone: [phone]  Phone: [phone] Fax: [phone]    Patient notified that their request is being sent to the clinical staff for review and that they should receive a response within 2 business days.   Please advise at Mobile [phone] (mobile)

## 2023-03-04 ENCOUNTER — Other Ambulatory Visit: Payer: Self-pay | Admitting: Family Medicine

## 2023-03-04 MED ORDER — FLUTICASONE PROPIONATE HFA 110 MCG/ACT IN AERO: 2.0000 | INHALATION_SPRAY | Freq: Two times a day (BID) | RESPIRATORY_TRACT | 1 refills | Status: AC

## 2023-03-04 MED ORDER — ALBUTEROL SULFATE (2.5 MG/3ML) 0.083% IN NEBU: 2.5000 mg | INHALATION_SOLUTION | Freq: Four times a day (QID) | RESPIRATORY_TRACT | 1 refills | Status: DC | PRN

## 2023-04-13 ENCOUNTER — Telehealth: Payer: Self-pay | Admitting: Family Medicine

## 2023-04-13 ENCOUNTER — Other Ambulatory Visit: Payer: Self-pay | Admitting: *Deleted

## 2023-04-13 MED ORDER — ALBUTEROL SULFATE HFA 108 (90 BASE) MCG/ACT IN AERS: 2.0000 | INHALATION_SPRAY | Freq: Four times a day (QID) | RESPIRATORY_TRACT | 2 refills | Status: AC | PRN

## 2023-04-13 NOTE — Telephone Encounter (Signed)
Spoke to patient to confirm that she didn't have a nebulizer machine and she stated that she did not. Patient stated she asked for albuterol inhaler. Rx sent to pharmacy.

## 2023-04-13 NOTE — Telephone Encounter (Signed)
Patient states she has never used a Nebulizer for Albuterol-has always used inhaler. States there may have been a mix up re: Albuterol when she became a new Patient.  Requests RX for Albuterol inhaler to be sent to:   [id number] - [place], [place] - [address] AT [facility] [address] Phone: [phone]  Fax: [phone]

## 2023-04-18 ENCOUNTER — Ambulatory Visit (INDEPENDENT_AMBULATORY_CARE_PROVIDER_SITE_OTHER): Payer: BC Managed Care – PPO | Admitting: Family Medicine

## 2023-04-18 ENCOUNTER — Encounter: Payer: Self-pay | Admitting: Family Medicine

## 2023-04-18 VITALS — BP 106/66 | HR 83 | Temp 98.7°F | Resp 16 | Ht 58.66 in | Wt 115.5 lb

## 2023-04-18 DIAGNOSIS — E538 Deficiency of other specified B group vitamins: Secondary | ICD-10-CM | POA: Diagnosis not present

## 2023-04-18 DIAGNOSIS — R0789 Other chest pain: Secondary | ICD-10-CM | POA: Diagnosis not present

## 2023-04-18 DIAGNOSIS — G44229 Chronic tension-type headache, not intractable: Secondary | ICD-10-CM | POA: Diagnosis not present

## 2023-04-18 DIAGNOSIS — K219 Gastro-esophageal reflux disease without esophagitis: Secondary | ICD-10-CM

## 2023-04-18 DIAGNOSIS — E559 Vitamin D deficiency, unspecified: Secondary | ICD-10-CM

## 2023-04-18 DIAGNOSIS — J4520 Mild intermittent asthma, uncomplicated: Secondary | ICD-10-CM | POA: Diagnosis not present

## 2023-04-18 DIAGNOSIS — L219 Seborrheic dermatitis, unspecified: Secondary | ICD-10-CM

## 2023-04-18 MED ORDER — KETOCONAZOLE 1 % EX SHAM: 1.0000 | MEDICATED_SHAMPOO | CUTANEOUS | 3 refills | Status: AC

## 2023-04-18 MED ORDER — ONDANSETRON HCL 4 MG PO TABS: 4.0000 mg | ORAL_TABLET | Freq: Three times a day (TID) | ORAL | 0 refills | Status: AC | PRN

## 2023-04-18 MED ORDER — CLOBETASOL PROPIONATE 0.05 % EX SOLN: 1.0000 | Freq: Two times a day (BID) | CUTANEOUS | 0 refills | Status: DC

## 2023-04-18 MED ORDER — OMEPRAZOLE 40 MG PO CPDR: 40.0000 mg | DELAYED_RELEASE_CAPSULE | Freq: Every day | ORAL | 1 refills | Status: DC

## 2023-04-18 MED ORDER — SELENIUM SULFIDE 2.25 % EX SHAM: 1.0000 | MEDICATED_SHAMPOO | CUTANEOUS | 3 refills | Status: AC

## 2023-04-18 NOTE — Progress Notes (Signed)
Subjective:     Patient ID: Tracy Hardin, female    DOB: May 02, 2001, 22 y.o.   MRN: [id number]  Chief Complaint  Patient presents with   Follow-up    3 month follow-up on ha, chest pain Asthma flare-up     HPI-master's program-computer science  HA-more motion/car. Some stress HA in back of neck at times.  CP-if not vomit, ok.  ED still there but doing better. occ does vomit which flares chest pain.  GERD-still feels if not take meds for 2 days, but on meds, good.  .  Asthma-flared last wk and 1 mo ago.  Taking albuterol more freq.  Wasn't taking flovent so back on.  Def vit B12 and D-taking OTC. And Mg.  More energy.  vegetarian Tripped and fell 1 mo ago-avulsion fx R ankle-PT. Saw ortho in [place].    There are no preventive care reminders to display for this patient.  Past Medical History:  Diagnosis Date   Asthma    Constitutional growth delay    Eczema    Goiter    Hashimoto's thyroiditis     Past Surgical History:  Procedure Laterality Date   NO PAST SURGERIES       Current Outpatient Medications:    acetaminophen (TYLENOL) 500 MG tablet, 2 tabs Orally prn during menstural cycle, Disp: , Rfl:    albuterol (VENTOLIN HFA) 108 (90 Base) MCG/ACT inhaler, Inhale 2 puffs into the lungs every 6 (six) hours as needed for wheezing or shortness of breath., Disp: 8 g, Rfl: 2   clobetasol (TEMOVATE) 0.05 % external solution, Apply 1 Application topically 2 (two) times daily., Disp: 50 mL, Rfl: 0   fexofenadine (ALLEGRA) 180 MG tablet, Take by mouth., Disp: , Rfl:    fluticasone (FLOVENT HFA) 110 MCG/ACT inhaler, Inhale 2 puffs into the lungs in the morning and at bedtime., Disp: 3 each, Rfl: 1   KETOCONAZOLE, TOPICAL, 1 % SHAM, Apply 1 each topically every other day., Disp: 125 mL, Rfl: 3   loteprednol (LOTEMAX) 0.5 % ophthalmic suspension, 1 drop as needed., Disp: , Rfl:    naproxen sodium (ALEVE) 220 MG tablet, Take by mouth as needed., Disp: , Rfl:    ondansetron  (ZOFRAN) 4 MG tablet, Take 1 tablet (4 mg total) by mouth every 8 (eight) hours as needed for nausea or vomiting., Disp: 20 tablet, Rfl: 0   Selenium Sulfide 2.25 % SHAM, Apply 1 each topically every other day., Disp: 180 mL, Rfl: 3   tretinoin (RETIN-A) 0.025 % cream, SMARTSIG:Sparingly Topical Every Night PRN, Disp: , Rfl:    triamcinolone cream (KENALOG) 0.5 %, APPLY TO AFFECTED AREA(S) TWICE DAILY AS NEEDED, Disp: , Rfl:    omeprazole (PRILOSEC) 40 MG capsule, Take 1 capsule (40 mg total) by mouth daily., Disp: 90 capsule, Rfl: 1  Allergies  Allergen Reactions   Black Walnut Pollen Allergy Skin Test Other (See Comments)   Pecan Nut (Diagnostic) Other (See Comments)   Walnut     Other Reaction(s): abdominal pain   Dust Mite Extract     Other Reaction(s): rashes on skin, irritation of eyes and nose   Pollen Extract     Other Reaction(s): irritation of eyes and nose   ROS neg/noncontributory except as noted HPI/below  Scalp flaking and itching more lately-selsun blue-not really helping.  Otc nizoral-not help. 1-2x/wk for 2 months. Itching and flakes.       Objective:     BP 106/66   Pulse 83  Temp 98.7 F (37.1 C) (Temporal)   Resp 16   Ht 4' 10.66" (1.49 m)   Wt 115 lb 8 oz (52.4 kg)   LMP 03/20/2023 (Exact Date)   SpO2 99%   BMI 23.60 kg/m  Wt Readings from Last 3 Encounters:  04/18/23 115 lb 8 oz (52.4 kg)  01/23/23 115 lb 4 oz (52.3 kg)  10/03/22 111 lb 2 oz (50.4 kg)    Physical Exam   Gen: WDWN NAD HEENT: NCAT, conjunctiva not injected, sclera nonicteric NECK:  supple, no thyromegaly, no nodes, no carotid bruits CARDIAC: RRR, S1S2+, no murmur. DP 2+B LUNGS: CTAB. No wheezes ABDOMEN:  BS+, soft, NTND, No HSM, no masses EXT:  no edema MSK: no gross abnormalities.  NEURO: A&O x3.  CN II-XII intact.  PSYCH: normal mood. Good eye contact Scalp: Flaky and dry.  Large irritated, red, flaky patch in the scalp near right ear.  Another small flaky patch right top  of head.     Assessment & Plan:  Chronic tension-type headache, not intractable -     Ondansetron HCl; Take 1 tablet (4 mg total) by mouth every 8 (eight) hours as needed for nausea or vomiting.  Dispense: 20 tablet; Refill: 0  Mild intermittent asthma without complication  Other chest pain  Gastroesophageal reflux disease without esophagitis -     Omeprazole; Take 1 capsule (40 mg total) by mouth daily.  Dispense: 90 capsule; Refill: 1  Vitamin B12 deficiency -     Vitamin B12  Vitamin D deficiency -     VITAMIN D 25 Hydroxy (Vit-D Deficiency, Fractures)  Seborrheic dermatitis of scalp  Other orders -     Selenium Sulfide; Apply 1 each topically every other day.  Dispense: 180 mL; Refill: 3 -     Ketoconazole; Apply 1 each topically every other day.  Dispense: 125 mL; Refill: 3 -     Clobetasol Propionate; Apply 1 Application topically 2 (two) times daily.  Dispense: 50 mL; Refill: 0  1.  Chronic tension type headache-also some features of possible motion sickness.  Advised to try closing eyes while a passenger, sea bands, ibuprofen, Zofran 4 mg.  For tension type headaches, discussed stretches. 2.  Mild intermittent asthma-chronic.  Seasonal.  Had been doing well until recently.  Had stopped using Flovent.  If she is having more problems again, restart Flovent.  Albuterol every 6 hours as needed. 3.  Chest pain-costochondritis and GERD.  Compounded after vomiting.  Does well when refrains from vomiting. 4.  GERD-chronic.  Controlled on omeprazole 40 mg daily.  Renewed 5.  Vitamin B12 deficiency-patient is taking supplements.  She is vegetarian.  Check labs 6.  Vitamin D deficiency-patient is vegetarian.  Taking supplements.  Check labs 7.  Scalp rash-seborrheic dermatitis versus possibly psoriasis.  Will do selenium sulfide shampoo, ketoconazole shampoo, clobetasol solution on the larger patch.  If worse, not improving, let me know.  Should see dermatology, however she is only in  town for the summer.  Advised to call and get scheduled with someone in [place].  Return if symptoms worsen or fail to improve.  Angelena Sole, MD

## 2023-04-18 NOTE — Patient Instructions (Addendum)
Sea bands in car. Try closing eyes in car. Ibuprofen.  zofran

## 2023-04-19 LAB — VITAMIN D 25 HYDROXY (VIT D DEFICIENCY, FRACTURES): VITD: 22.18 ng/mL — ABNORMAL LOW (ref 30.00–100.00)

## 2023-04-19 LAB — VITAMIN B12: Vitamin B-12: 176 pg/mL — ABNORMAL LOW (ref 211–911)

## 2023-04-19 NOTE — Progress Notes (Signed)
Her B12 is not improving  if she is taking it regularly, then she may not be absorbing it.  She can either get sublingual(or dissolving tabs), or do injections D is not much improved  needs to double the dose

## 2023-04-26 ENCOUNTER — Other Ambulatory Visit: Payer: Self-pay | Admitting: *Deleted

## 2023-04-26 DIAGNOSIS — E538 Deficiency of other specified B group vitamins: Secondary | ICD-10-CM

## 2023-05-18 ENCOUNTER — Ambulatory Visit (INDEPENDENT_AMBULATORY_CARE_PROVIDER_SITE_OTHER): Payer: BC Managed Care – PPO | Admitting: Family

## 2023-05-18 VITALS — BP 100/67 | HR 69 | Temp 96.0°F | Ht <= 58 in | Wt 116.8 lb

## 2023-05-18 DIAGNOSIS — G44209 Tension-type headache, unspecified, not intractable: Secondary | ICD-10-CM

## 2023-05-18 NOTE — Progress Notes (Signed)
Patient ID: Tracy Hardin, female    DOB: 2000/12/14, 22 y.o.   MRN: [id number]  Chief Complaint  Patient presents with   Ear Pain    Pt c/o right ear pain and right sided headaches since yesterday but has been getting worse. Has tried ibuprofen and aleve , which did not help. Head is Throbbing when she stands but not when laying down.     HPI:      Ear & head pain:  reports starting yesterday worse with standing, eases off with lying  down, right ear started feeling muffled as well. Going to fly on a plane tomorrow and was concerned if she had an infection. Denies any other sinus symptoms, dizziness, or nausea. A friend told her her sx could be r/t POTS. Current pain 3/10 after 1 Aleve, earlier had been 6-7/10.  Reports hydrating and eating yesterday and today, denies any increased stress or anxiety. Reports allergy to dust mites & pollen and takes OTC Allegra daily.  Assessment & Plan:  1. Acute non intractable tension-type headache - reassured pt she does not have the classical presentation for POTS and is not usually accompanied by HA. Advised to continue 1 Aleve bid for next 2 days to keep HA under control. Continue taking Allegra. Be sure carpet/floor around her bed is vacuumed often and bed linens cleaned regularly as dust mite accumulation can bring on headaches. RTO precautions provided.   Subjective:    Outpatient Medications Prior to Visit  Medication Sig Dispense Refill   acetaminophen (TYLENOL) 500 MG tablet 2 tabs Orally prn during menstural cycle     albuterol (VENTOLIN HFA) 108 (90 Base) MCG/ACT inhaler Inhale 2 puffs into the lungs every 6 (six) hours as needed for wheezing or shortness of breath. 8 g 2   fexofenadine (ALLEGRA) 180 MG tablet Take by mouth.     fluticasone (FLOVENT HFA) 110 MCG/ACT inhaler Inhale 2 puffs into the lungs in the morning and at bedtime. 3 each 1   KETOCONAZOLE, TOPICAL, 1 % SHAM Apply 1 each topically every other day. 125 mL 3   naproxen  sodium (ALEVE) 220 MG tablet Take by mouth as needed.     omeprazole (PRILOSEC) 40 MG capsule Take 1 capsule (40 mg total) by mouth daily. 90 capsule 1   ondansetron (ZOFRAN) 4 MG tablet Take 1 tablet (4 mg total) by mouth every 8 (eight) hours as needed for nausea or vomiting. 20 tablet 0   Selenium Sulfide 2.25 % SHAM Apply 1 each topically every other day. 180 mL 3   tretinoin (RETIN-A) 0.025 % cream SMARTSIG:Sparingly Topical Every Night PRN     triamcinolone cream (KENALOG) 0.5 % APPLY TO AFFECTED AREA(S) TWICE DAILY AS NEEDED     clobetasol (TEMOVATE) 0.05 % external solution Apply 1 Application topically 2 (two) times daily. (Patient not taking: Reported on 05/18/2023) 50 mL 0   loteprednol (LOTEMAX) 0.5 % ophthalmic suspension 1 drop as needed. (Patient not taking: Reported on 05/18/2023)     No facility-administered medications prior to visit.   Past Medical History:  Diagnosis Date   Asthma    Constitutional growth delay    Eczema    Goiter    Hashimoto's thyroiditis    Past Surgical History:  Procedure Laterality Date   NO PAST SURGERIES     Allergies  Allergen Reactions   Black Walnut Pollen Allergy Skin Test Other (See Comments)   Pecan Nut (Diagnostic) Other (See Comments)   [facility]  Other Reaction(s): abdominal pain   Dust Mite Extract     Other Reaction(s): rashes on skin, irritation of eyes and nose   Pollen Extract     Other Reaction(s): irritation of eyes and nose      Objective:    Physical Exam Vitals and nursing note reviewed.  Constitutional:      Appearance: Normal appearance.  HENT:     Right Ear: Tympanic membrane and ear canal normal.     Left Ear: Tympanic membrane and ear canal normal.     Mouth/Throat:     Mouth: Mucous membranes are moist.     Pharynx: No pharyngeal swelling, oropharyngeal exudate, posterior oropharyngeal erythema, uvula swelling or postnasal drip.     Tonsils: No tonsillar exudate or tonsillar abscesses.  Eyes:      General: Vision grossly intact. Gaze aligned appropriately. No allergic shiner.    Extraocular Movements: Extraocular movements intact.     Conjunctiva/sclera: Conjunctivae normal.  Cardiovascular:     Rate and Rhythm: Normal rate and regular rhythm.  Pulmonary:     Effort: Pulmonary effort is normal.     Breath sounds: Normal breath sounds.  Musculoskeletal:        General: Normal range of motion.  Lymphadenopathy:     Head:     Right side of head: No tonsillar, preauricular, posterior auricular or occipital adenopathy.     Left side of head: No tonsillar, preauricular, posterior auricular or occipital adenopathy.     Cervical: No cervical adenopathy.  Skin:    General: Skin is warm and dry.  Neurological:     Mental Status: She is alert.  Psychiatric:        Mood and Affect: Mood normal.        Behavior: Behavior normal.    BP 100/67   Pulse 69   Temp (!) 96 F (35.6 C) (Temporal)   Ht 4\' 10"  (1.473 m)   Wt 116 lb 12.8 oz (53 kg)   LMP 03/20/2023 (Exact Date)   SpO2 100%   BMI 24.41 kg/m  Wt Readings from Last 3 Encounters:  05/18/23 116 lb 12.8 oz (53 kg)  04/18/23 115 lb 8 oz (52.4 kg)  01/23/23 115 lb 4 oz (52.3 kg)       Dulce Sellar, NP

## 2023-05-25 ENCOUNTER — Other Ambulatory Visit: Payer: BC Managed Care – PPO

## 2023-05-28 ENCOUNTER — Other Ambulatory Visit (INDEPENDENT_AMBULATORY_CARE_PROVIDER_SITE_OTHER): Payer: BC Managed Care – PPO

## 2023-05-28 DIAGNOSIS — E538 Deficiency of other specified B group vitamins: Secondary | ICD-10-CM | POA: Diagnosis not present

## 2023-05-28 LAB — VITAMIN B12: Vitamin B-12: 764 pg/mL (ref 211–911)

## 2023-06-07 ENCOUNTER — Ambulatory Visit (INDEPENDENT_AMBULATORY_CARE_PROVIDER_SITE_OTHER): Payer: BC Managed Care – PPO | Admitting: Family

## 2023-06-07 VITALS — BP 104/67 | HR 67 | Temp 97.8°F | Ht <= 58 in | Wt 118.2 lb

## 2023-06-07 DIAGNOSIS — R350 Frequency of micturition: Secondary | ICD-10-CM | POA: Diagnosis not present

## 2023-06-07 LAB — POCT URINALYSIS DIPSTICK
Bilirubin, UA: NEGATIVE
Blood, UA: NEGATIVE
Glucose, UA: NEGATIVE
Ketones, UA: NEGATIVE
Leukocytes, UA: NEGATIVE
Nitrite, UA: NEGATIVE
Protein, UA: NEGATIVE
Spec Grav, UA: 1.015 (ref 1.010–1.025)
Urobilinogen, UA: 0.2 E.U./dL
pH, UA: 7 (ref 5.0–8.0)

## 2023-06-07 MED ORDER — SULFAMETHOXAZOLE-TRIMETHOPRIM 800-160 MG PO TABS
1.0000 | ORAL_TABLET | Freq: Two times a day (BID) | ORAL | 0 refills | Status: DC
Start: 2023-06-07 — End: 2023-06-13

## 2023-06-07 NOTE — Progress Notes (Addendum)
Patient ID: Tracy Hardin, female    DOB: 2001-05-11, 22 y.o.   MRN: [id number]  Chief Complaint  Patient presents with   Urinary Frequency    Pt c/o urinary frequency, odor and dysuria, present for 1-2 days. Has tried azo which did help slightly.     HPI:      UTI sx:  Pt c/o urinary frequency, odor and dysuria, present for 1-2 days. Has tried azo which did help slightly. Pt denies low back or pelvic pain, no fever or nausea, no vaginal sx. Reports getting UTIs frequently, was seen by UC up in [place] last time.  Assessment & Plan:  1. Urinary frequency- UA neg, pt took AZO, sending culture. Advised next time to use UTI test strip before taking AZO and if sx persist let us know. Sending Bactrim based on sx and hx, pt advised may have to change based on culture results. Advised on drinking at least 2L water daily.  - POCT Urinalysis Dipstick - Urine Culture - sulfamethoxazole-trimethoprim (BACTRIM DS) 800-160 MG tablet; Take 1 tablet by mouth 2 (two) times daily after a meal.  Dispense: 6 tablet; Refill: 0  Subjective:    Outpatient Medications Prior to Visit  Medication Sig Dispense Refill   acetaminophen (TYLENOL) 500 MG tablet 2 tabs Orally prn during menstural cycle     albuterol (VENTOLIN HFA) 108 (90 Base) MCG/ACT inhaler Inhale 2 puffs into the lungs every 6 (six) hours as needed for wheezing or shortness of breath. 8 g 2   clobetasol (TEMOVATE) 0.05 % external solution Apply 1 Application topically 2 (two) times daily. 50 mL 0   fexofenadine (ALLEGRA) 180 MG tablet Take by mouth.     fluticasone (FLOVENT HFA) 110 MCG/ACT inhaler Inhale 2 puffs into the lungs in the morning and at bedtime. 3 each 1   KETOCONAZOLE, TOPICAL, 1 % SHAM Apply 1 each topically every other day. 125 mL 3   loteprednol (LOTEMAX) 0.5 % ophthalmic suspension 1 drop as needed.     naproxen sodium (ALEVE) 220 MG tablet Take by mouth as needed.     omeprazole (PRILOSEC) 40 MG capsule Take 1 capsule (40 mg  total) by mouth daily. 90 capsule 1   ondansetron (ZOFRAN) 4 MG tablet Take 1 tablet (4 mg total) by mouth every 8 (eight) hours as needed for nausea or vomiting. 20 tablet 0   Selenium Sulfide 2.25 % SHAM Apply 1 each topically every other day. 180 mL 3   tretinoin (RETIN-A) 0.025 % cream SMARTSIG:Sparingly Topical Every Night PRN     triamcinolone cream (KENALOG) 0.5 % APPLY TO AFFECTED AREA(S) TWICE DAILY AS NEEDED     No facility-administered medications prior to visit.   Past Medical History:  Diagnosis Date   Asthma    Constitutional growth delay    Eczema    Goiter    Hashimoto's thyroiditis    Past Surgical History:  Procedure Laterality Date   NO PAST SURGERIES     Allergies  Allergen Reactions   Black Walnut Pollen Allergy Skin Test Other (See Comments)   Pecan Nut (Diagnostic) Other (See Comments)   Walnut     Other Reaction(s): abdominal pain   Dust Mite Extract     Other Reaction(s): rashes on skin, irritation of eyes and nose   Pollen Extract     Other Reaction(s): irritation of eyes and nose      Objective:    Physical Exam Vitals and nursing note reviewed.  Constitutional:  Appearance: Normal appearance.  Cardiovascular:     Rate and Rhythm: Normal rate and regular rhythm.  Pulmonary:     Effort: Pulmonary effort is normal.     Breath sounds: Normal breath sounds.  Musculoskeletal:        General: Normal range of motion.  Skin:    General: Skin is warm and dry.  Neurological:     Mental Status: She is alert.  Psychiatric:        Mood and Affect: Mood normal.        Behavior: Behavior normal.    Ht 4\' 10"  (1.473 m)   Wt 118 lb 3.2 oz (53.6 kg)   BMI 24.70 kg/m  Wt Readings from Last 3 Encounters:  06/07/23 118 lb 3.2 oz (53.6 kg)  05/18/23 116 lb 12.8 oz (53 kg)  04/18/23 115 lb 8 oz (52.4 kg)      Dulce Sellar, NP

## 2023-06-07 NOTE — Addendum Note (Signed)
Addended byDulce Sellar on: 06/07/2023 01:52 PM   Modules accepted: Orders

## 2023-06-11 ENCOUNTER — Telehealth: Payer: Self-pay | Admitting: Family Medicine

## 2023-06-11 ENCOUNTER — Encounter: Payer: Self-pay | Admitting: Family Medicine

## 2023-06-11 ENCOUNTER — Encounter: Payer: Self-pay | Admitting: Radiology

## 2023-06-11 DIAGNOSIS — Z0000 Encounter for general adult medical examination without abnormal findings: Secondary | ICD-10-CM

## 2023-06-11 NOTE — Telephone Encounter (Signed)
Caller is patient's father. States patient is requesting a referral to Dr. Maxie Better at [facility]. States she just wants to get established with her.    Caprice Renshaw [facility] Phone: [phone] Fax: [phone]

## 2023-06-13 ENCOUNTER — Encounter: Payer: Self-pay | Admitting: Physician Assistant

## 2023-06-13 ENCOUNTER — Other Ambulatory Visit (HOSPITAL_COMMUNITY)
Admission: RE | Admit: 2023-06-13 | Discharge: 2023-06-13 | Disposition: A | Discharge status: Home / Self Care | Payer: BC Managed Care – PPO | Source: Ambulatory Visit

## 2023-06-13 ENCOUNTER — Ambulatory Visit: Payer: BC Managed Care – PPO | Admitting: Physician Assistant

## 2023-06-13 VITALS — BP 110/70 | HR 87 | Temp 98.0°F | Ht <= 58 in | Wt 116.4 lb

## 2023-06-13 DIAGNOSIS — R399 Unspecified symptoms and signs involving the genitourinary system: Secondary | ICD-10-CM

## 2023-06-13 DIAGNOSIS — R32 Unspecified urinary incontinence: Secondary | ICD-10-CM

## 2023-06-13 LAB — POCT URINALYSIS DIPSTICK
Bilirubin, UA: NEGATIVE
Blood, UA: NEGATIVE
Glucose, UA: NEGATIVE
Ketones, UA: NEGATIVE
Leukocytes, UA: NEGATIVE
Nitrite, UA: NEGATIVE
Protein, UA: NEGATIVE
Spec Grav, UA: 1.015 (ref 1.010–1.025)
Urobilinogen, UA: 0.2 E.U./dL
pH, UA: 6.5 (ref 5.0–8.0)

## 2023-06-13 NOTE — Patient Instructions (Signed)
It was great to see you!  We will be in touch with all results  Suspect possible interstitial cystitis  Try to establish with gynecology at some point, may need pelvic floor physical therapy for incontinence -- ask Dr Ruthine Dose if ongoing  Take care,  Jarold Motto PA-C

## 2023-06-13 NOTE — Progress Notes (Signed)
Tracy Hardin is a 22 y.o. female here for a follow up of a pre-existing problem.  History of Present Illness:   Chief Complaint  Patient presents with   Urinary Incontinence    Pt c/o urinary frequency, incontinence and urgency. Denies back pain, no fever or chills. Started a week ago.    HPI  Urinary frequency Seen here for urinary frequency on 06/07/23 and her culture was negative at the time. Was given bactrim, did not take because culture was normal. Has not taken AZO today. Odor and dysuria have resolved but she is still having urinary urgency, discomfort, uncontrolled urination. History of E. Coli urinary tract infections and she normally has them 2-3 times a year. Believes she may not be hydrating enough. Denies constipation, bowel incontinence.  Recently received a referral to a gynecologist.  Uncontrolled urination seems to be associated with coughing. Denies unusual back pain.  Menses are normal.  Past Medical History:  Diagnosis Date   Asthma    Constitutional growth delay    Eczema    Goiter    Hashimoto's thyroiditis      Social History   Tobacco Use   Smoking status: Never   Smokeless tobacco: Never  Vaping Use   Vaping status: Never Used  Substance Use Topics   Alcohol use: Yes   Drug use: Never    Past Surgical History:  Procedure Laterality Date   NO PAST SURGERIES      Family History  Problem Relation Age of Onset   Hypertension Mother    Diabetes Mother        type 2   Crohn's disease Mother    Thyroid disease Maternal Aunt    Hypertension Maternal Grandmother    Diabetes Maternal Grandmother        type 2    Allergies  Allergen Reactions   Black Walnut Pollen Allergy Skin Test Other (See Comments)   Pecan Nut (Diagnostic) Other (See Comments)   Walnut     Other Reaction(s): abdominal pain   Dust Mite Extract     Other Reaction(s): rashes on skin, irritation of eyes and nose   Pollen Extract     Other Reaction(s): irritation  of eyes and nose    Current Medications:   Current Outpatient Medications:    acetaminophen (TYLENOL) 500 MG tablet, 2 tabs Orally prn during menstural cycle, Disp: , Rfl:    albuterol (VENTOLIN HFA) 108 (90 Base) MCG/ACT inhaler, Inhale 2 puffs into the lungs every 6 (six) hours as needed for wheezing or shortness of breath., Disp: 8 g, Rfl: 2   clobetasol (TEMOVATE) 0.05 % external solution, Apply 1 Application topically 2 (two) times daily., Disp: 50 mL, Rfl: 0   fexofenadine (ALLEGRA) 180 MG tablet, Take by mouth., Disp: , Rfl:    fluticasone (FLOVENT HFA) 110 MCG/ACT inhaler, Inhale 2 puffs into the lungs in the morning and at bedtime., Disp: 3 each, Rfl: 1   KETOCONAZOLE, TOPICAL, 1 % SHAM, Apply 1 each topically every other day., Disp: 125 mL, Rfl: 3   loteprednol (LOTEMAX) 0.5 % ophthalmic suspension, 1 drop as needed., Disp: , Rfl:    naproxen sodium (ALEVE) 220 MG tablet, Take by mouth as needed., Disp: , Rfl:    omeprazole (PRILOSEC) 40 MG capsule, Take 1 capsule (40 mg total) by mouth daily., Disp: 90 capsule, Rfl: 1   ondansetron (ZOFRAN) 4 MG tablet, Take 1 tablet (4 mg total) by mouth every 8 (eight) hours as needed for nausea  or vomiting., Disp: 20 tablet, Rfl: 0   Selenium Sulfide 2.25 % SHAM, Apply 1 each topically every other day., Disp: 180 mL, Rfl: 3   tretinoin (RETIN-A) 0.025 % cream, SMARTSIG:Sparingly Topical Every Night PRN, Disp: , Rfl:    triamcinolone cream (KENALOG) 0.5 %, APPLY TO AFFECTED AREA(S) TWICE DAILY AS NEEDED, Disp: , Rfl:    Review of Systems:   Review of Systems  Constitutional:  Negative for fever and malaise/fatigue.  HENT:  Negative for congestion.   Eyes:  Negative for blurred vision.  Respiratory:  Negative for cough and shortness of breath.   Cardiovascular:  Negative for chest pain, palpitations and leg swelling.  Gastrointestinal:  Negative for constipation and vomiting.       (-) Bowel incontinence  Genitourinary:  Positive for  urgency. Negative for dysuria and flank pain.       (+) Uncontrolled urination  Musculoskeletal:  Negative for back pain.  Skin:  Negative for rash.  Neurological:  Negative for loss of consciousness and headaches.    Vitals:   Vitals:   06/13/23 1134  BP: 110/70  Pulse: 87  Temp: 98 F (36.7 C)  TempSrc: Temporal  SpO2: 98%  Weight: 116 lb 6.1 oz (52.8 kg)  Height: 4\' 10"  (1.473 m)     Body mass index is 24.32 kg/m.  Physical Exam:   Physical Exam Vitals and nursing note reviewed. Exam conducted with a chaperone present.  Constitutional:      General: She is not in acute distress.    Appearance: She is well-developed. She is not ill-appearing or toxic-appearing.  Cardiovascular:     Rate and Rhythm: Normal rate and regular rhythm.     Pulses: Normal pulses.     Heart sounds: Normal heart sounds, S1 normal and S2 normal.  Pulmonary:     Effort: Pulmonary effort is normal.     Breath sounds: Normal breath sounds.  Genitourinary:    Vagina: Normal.     Comments: PAP attempted however patient experienced too much discomfort to proceed so only vaginal swab was obtained Skin:    General: Skin is warm and dry.  Neurological:     Mental Status: She is alert.     GCS: GCS eye subscore is 4. GCS verbal subscore is 5. GCS motor subscore is 6.  Psychiatric:        Speech: Speech normal.        Behavior: Behavior normal. Behavior is cooperative.    Results for orders placed or performed in visit on 06/13/23  POCT urinalysis dipstick  Result Value Ref Range   Color, UA yellow    Clarity, UA cloudy    Glucose, UA Negative Negative   Bilirubin, UA Negative    Ketones, UA Negative    Spec Grav, UA 1.015 1.010 - 1.025   Blood, UA Negative    pH, UA 6.5 5.0 - 8.0   Protein, UA Negative Negative   Urobilinogen, UA 0.2 0.2 or 1.0 E.U./dL   Nitrite, UA Negative    Leukocytes, UA Negative Negative   Appearance     Odor      Assessment and Plan:   UTI symptoms No red  flags UA normal Cervical swab obtained to assess for bacterial vaginosis and yeast Urine culture sent as well Consider interstitial cystitis diagnosis - discussed with her Recommend consider urology referral or follow-up with PCP  Urinary incontinence, unspecified type Recommend follow-up with PCP, gynecology No red flag symptom(s) suggesting other neurological  process Consider pelvic floor physical therapy    I,Alexander Ruley,acting as a scribe for [employer], PA.,have documented all relevant documentation on the behalf of Jarold Motto, PA,as directed by  Jarold Motto, PA while in the presence of Jarold Motto, [place].   I, Jarold Motto, [place], have reviewed all documentation for this visit. The documentation on 06/13/23 for the exam, diagnosis, procedures, and orders are all accurate and complete.  I spent a total of 30 minutes on this visit, today 06/13/23, which included reviewing previous notes from 06/07/23, ordering tests, discussing plan of care with patient and using shared-decision making on next steps, refilling medications, and documenting the findings in the note.    Jarold Motto, PA-C

## 2023-06-14 LAB — URINE CULTURE
MICRO NUMBER:: 15330045
Result:: NO GROWTH
SPECIMEN QUALITY:: ADEQUATE

## 2023-06-14 LAB — CERVICOVAGINAL ANCILLARY ONLY
Bacterial Vaginitis (gardnerella): NEGATIVE
Candida Glabrata: NEGATIVE
Candida Vaginitis: NEGATIVE
Comment: NEGATIVE
Comment: NEGATIVE
Comment: NEGATIVE

## 2023-10-17 ENCOUNTER — Ambulatory Visit: Payer: BC Managed Care – PPO | Admitting: Family Medicine

## 2023-10-17 ENCOUNTER — Encounter: Payer: Self-pay | Admitting: Family Medicine

## 2023-10-17 VITALS — BP 109/74 | HR 74 | Temp 98.0°F | Resp 16 | Ht <= 58 in | Wt 116.5 lb

## 2023-10-17 DIAGNOSIS — R399 Unspecified symptoms and signs involving the genitourinary system: Secondary | ICD-10-CM

## 2023-10-17 DIAGNOSIS — E049 Nontoxic goiter, unspecified: Secondary | ICD-10-CM | POA: Diagnosis not present

## 2023-10-17 DIAGNOSIS — L219 Seborrheic dermatitis, unspecified: Secondary | ICD-10-CM

## 2023-10-17 DIAGNOSIS — K219 Gastro-esophageal reflux disease without esophagitis: Secondary | ICD-10-CM

## 2023-10-17 DIAGNOSIS — J4520 Mild intermittent asthma, uncomplicated: Secondary | ICD-10-CM | POA: Diagnosis not present

## 2023-10-17 DIAGNOSIS — E538 Deficiency of other specified B group vitamins: Secondary | ICD-10-CM

## 2023-10-17 DIAGNOSIS — E559 Vitamin D deficiency, unspecified: Secondary | ICD-10-CM

## 2023-10-17 DIAGNOSIS — G44229 Chronic tension-type headache, not intractable: Secondary | ICD-10-CM

## 2023-10-17 DIAGNOSIS — R350 Frequency of micturition: Secondary | ICD-10-CM

## 2023-10-17 LAB — COMPREHENSIVE METABOLIC PANEL
ALT: 13 U/L (ref 0–35)
AST: 17 U/L (ref 0–37)
Albumin: 4.2 g/dL (ref 3.5–5.2)
Alkaline Phosphatase: 57 U/L (ref 39–117)
BUN: 8 mg/dL (ref 6–23)
CO2: 27 meq/L (ref 19–32)
Calcium: 9.3 mg/dL (ref 8.4–10.5)
Chloride: 105 meq/L (ref 96–112)
Creatinine, Ser: 0.62 mg/dL (ref 0.40–1.20)
GFR: 125.2 mL/min (ref >60.00)
Glucose, Bld: 85 mg/dL (ref 70–99)
Potassium: 4 meq/L (ref 3.5–5.1)
Sodium: 139 meq/L (ref 135–145)
Total Bilirubin: 0.3 mg/dL (ref 0.2–1.2)
Total Protein: 7 g/dL (ref 6.0–8.3)

## 2023-10-17 LAB — TSH: TSH: 0.95 u[IU]/mL (ref 0.35–5.50)

## 2023-10-17 LAB — CBC WITH DIFFERENTIAL/PLATELET
Basophils Absolute: 0 10*3/uL (ref 0.0–0.1)
Basophils Relative: 0.6 % (ref 0.0–3.0)
Eosinophils Absolute: 0.1 10*3/uL (ref 0.0–0.7)
Eosinophils Relative: 3 % (ref 0.0–5.0)
HCT: 38 % (ref 36.0–46.0)
Hemoglobin: 12.3 g/dL (ref 12.0–15.0)
Lymphocytes Relative: 38.5 % (ref 12.0–46.0)
Lymphs Abs: 1.9 10*3/uL (ref 0.7–4.0)
MCHC: 32.5 g/dL (ref 30.0–36.0)
MCV: 78.3 fL (ref 78.0–100.0)
Monocytes Absolute: 0.3 10*3/uL (ref 0.1–1.0)
Monocytes Relative: 6.8 % (ref 3.0–12.0)
Neutro Abs: 2.5 10*3/uL (ref 1.4–7.7)
Neutrophils Relative %: 51.1 % (ref 43.0–77.0)
Platelets: 314 10*3/uL (ref 150.0–400.0)
RBC: 4.86 Mil/uL (ref 3.87–5.11)
RDW: 13.2 % (ref 11.5–15.5)
WBC: 4.8 10*3/uL (ref 4.0–10.5)

## 2023-10-17 LAB — POC URINALSYSI DIPSTICK (AUTOMATED)
Bilirubin, UA: NEGATIVE
Blood, UA: NEGATIVE
Glucose, UA: NEGATIVE
Ketones, UA: NEGATIVE
Leukocytes, UA: NEGATIVE
Nitrite, UA: NEGATIVE
Protein, UA: NEGATIVE
Spec Grav, UA: <=1.005 — AB (ref 1.010–1.025)
Urobilinogen, UA: 0.2 U/dL
pH, UA: 6 (ref 5.0–8.0)

## 2023-10-17 LAB — IBC + FERRITIN
Ferritin: 4.9 ng/mL — ABNORMAL LOW (ref 10.0–291.0)
Iron: 43 ug/dL (ref 42–145)
Saturation Ratios: 9.8 % — ABNORMAL LOW (ref 20.0–50.0)
TIBC: 441 ug/dL (ref 250.0–450.0)
Transferrin: 315 mg/dL (ref 212.0–360.0)

## 2023-10-17 LAB — VITAMIN B12: Vitamin B-12: 867 pg/mL (ref 211–911)

## 2023-10-17 LAB — VITAMIN D 25 HYDROXY (VIT D DEFICIENCY, FRACTURES): VITD: 24.94 ng/mL — ABNORMAL LOW (ref 30.00–100.00)

## 2023-10-17 MED ORDER — OMEPRAZOLE 40 MG PO CPDR: 40.0000 mg | DELAYED_RELEASE_CAPSULE | Freq: Every day | ORAL | 1 refills | Status: DC

## 2023-10-17 NOTE — Patient Instructions (Signed)

## 2023-10-17 NOTE — Progress Notes (Signed)
Subjective:     Patient ID: Tracy Hardin, female    DOB: Dec 18, 2000, 22 y.o.   MRN: [id number]  Chief Complaint  Patient presents with   Follow-up   Urinary Frequency   Urinary Incontinence    Sx started this morning   Urinary Urgency    HPI-master's program-computer science  HA-more motion/car. Some stress HA in back of neck at times. Worse if lack of sleep.  CP-if not vomit, ok.  ED still there but doing better. occ does vomit which flares chest pain. Seeing nutritionist. GERD-still feels if not take meds for 2 days, but on meds, good.  .  Asthma- taking flovent seasonally .  Albuterol about every few wks or if exercise for 1 hr.  Def vit B12 and D-taking OTC. And Mg.  More energy.  Vegetarian Urinary freq, urgency, going small amts-since this am. Not SA. Flew a few days ago. Not drinking as much water since back at home. No dysuria.  Stressed. No f/c Scalp lesions better on keto shampoo     There are no preventive care reminders to display for this patient.   Past Medical History:  Diagnosis Date   Asthma    Constitutional growth delay    Eczema    Goiter    Hashimoto's thyroiditis     Past Surgical History:  Procedure Laterality Date   NO PAST SURGERIES       Current Outpatient Medications:    acetaminophen (TYLENOL) 500 MG tablet, 2 tabs Orally prn during menstural cycle, Disp: , Rfl:    albuterol (VENTOLIN HFA) 108 (90 Base) MCG/ACT inhaler, Inhale 2 puffs into the lungs every 6 (six) hours as needed for wheezing or shortness of breath., Disp: 8 g, Rfl: 2   fexofenadine (ALLEGRA) 180 MG tablet, Take by mouth., Disp: , Rfl:    fluticasone (FLOVENT HFA) 110 MCG/ACT inhaler, Inhale 2 puffs into the lungs in the morning and at bedtime., Disp: 3 each, Rfl: 1   KETOCONAZOLE, TOPICAL, 1 % SHAM, Apply 1 each topically every other day., Disp: 125 mL, Rfl: 3   loteprednol (LOTEMAX) 0.5 % ophthalmic suspension, 1 drop as needed., Disp: , Rfl:    naproxen sodium (ALEVE)  220 MG tablet, Take by mouth as needed., Disp: , Rfl:    NIZORAL A-D 1 % SHAM, SMARTSIG:Topical Every Other Day, Disp: , Rfl:    ondansetron (ZOFRAN) 4 MG tablet, Take 1 tablet (4 mg total) by mouth every 8 (eight) hours as needed for nausea or vomiting., Disp: 20 tablet, Rfl: 0   Selenium Sulfide 2.25 % SHAM, Apply 1 each topically every other day., Disp: 180 mL, Rfl: 3   tretinoin (RETIN-A) 0.025 % cream, SMARTSIG:Sparingly Topical Every Night PRN, Disp: , Rfl:    triamcinolone cream (KENALOG) 0.5 %, APPLY TO AFFECTED AREA(S) TWICE DAILY AS NEEDED, Disp: , Rfl:    omeprazole (PRILOSEC) 40 MG capsule, Take 1 capsule (40 mg total) by mouth daily., Disp: 90 capsule, Rfl: 1  Allergies  Allergen Reactions   Black Walnut Pollen Allergy Skin Test Other (See Comments)   Pecan Nut (Diagnostic) Other (See Comments)   Walnut     Other Reaction(s): abdominal pain   Dust Mite Extract     Other Reaction(s): rashes on skin, irritation of eyes and nose   Pollen Extract     Other Reaction(s): irritation of eyes and nose   ROS neg/noncontributory except as noted HPI/below  Lifting wts.  One day, felt "off" and near syncopal.  Vomited and better.  Scared to go back. May have been on menses.      Objective:     BP 109/74   Pulse 74   Temp 98 F (36.7 C) (Temporal)   Resp 16   Ht 4\' 10"  (1.473 m)   Wt 116 lb 8 oz (52.8 kg)   LMP 10/07/2023 (Exact Date)   SpO2 98%   BMI 24.35 kg/m  Wt Readings from Last 3 Encounters:  10/17/23 116 lb 8 oz (52.8 kg)  06/13/23 116 lb 6.1 oz (52.8 kg)  06/07/23 118 lb 3.2 oz (53.6 kg)    Physical Exam   Gen: WDWN NAD HEENT: NCAT, conjunctiva not injected, sclera nonicteric NECK:  supple, no thyromegaly, no nodes, no carotid bruits CARDIAC: RRR, S1S2+, no murmur. DP 2+B LUNGS: CTAB. No wheezes ABDOMEN:  BS+, soft, NTND, No HSM, no masses EXT:  no edema MSK: no gross abnormalities.  NEURO: A&O x3.  CN II-XII intact.  PSYCH: normal mood. Good eye  contact   Results for orders placed or performed in visit on 10/17/23  POCT Urinalysis Dipstick (Automated)   Collection Time: 10/17/23 10:24 AM  Result Value Ref Range   Color, UA LIGHT YELLOW    Clarity, UA CLEAR    Glucose, UA Negative Negative   Bilirubin, UA NEG    Ketones, UA NEG    Spec Grav, UA <=1.005 (A) 1.010 - 1.025   Blood, UA NEG    pH, UA 6.0 5.0 - 8.0   Protein, UA Negative Negative   Urobilinogen, UA 0.2 0.2 or 1.0 E.U./dL   Nitrite, UA NEG    Leukocytes, UA Negative Negative       Assessment & Plan:  Gastroesophageal reflux disease without esophagitis Assessment & Plan: Chronic.  Controlled on omeprazole 40mg  daily  Orders: -     Omeprazole; Take 1 capsule (40 mg total) by mouth daily.  Dispense: 90 capsule; Refill: 1 -     Comprehensive metabolic panel -     Vitamin B12  Chronic tension-type headache, not intractable Assessment & Plan: Chronic.  Better if not in a car and gets enough sleep.  Taking Mg daily and helping   Mild intermittent asthma without complication Assessment & Plan: Chronic.  Controlled w/prn albuterol now.  Will use flovent when season flares.   Seborrheic dermatitis of scalp Assessment & Plan: Chronic.  Well controlled on ketoconazole shampoo.   UTI symptoms -     POCT Urinalysis Dipstick (Automated)  Urinary frequency -     POCT Urinalysis Dipstick (Automated)  Vitamin B12 deficiency Assessment & Plan: Chronic.  Controlled on supps.  Continue.  Pt on chronic PPI and vegetarian  Orders: -     CBC with Differential/Platelet -     IBC + Ferritin -     Vitamin B12  Vitamin D deficiency Assessment & Plan: Chronic.  On supps.  Check labs  Orders: -     VITAMIN D 25 Hydroxy (Vit-D Deficiency, Fractures)  Goiter Assessment & Plan: Chronic.  Controlled.  Check tsh  Orders: -     TSH  Urinary symptoms.  Ua neg.  Not SA.  Try increasing fluids.  If not improving, then abx.  Return in about 6 months (around  04/16/2024) for chronic follow-up.  Angelena Sole, MD

## 2023-10-17 NOTE — Assessment & Plan Note (Signed)
Chronic.  Well controlled on ketoconazole shampoo.

## 2023-10-17 NOTE — Assessment & Plan Note (Signed)
Chronic.  On supps.  Check labs

## 2023-10-17 NOTE — Assessment & Plan Note (Signed)
Chronic.  Better if not in a car and gets enough sleep.  Taking Mg daily and helping

## 2023-10-17 NOTE — Progress Notes (Signed)
Labs mostly ok Vitamin D a little low-increase by 1000iu/d Iron a little low-no anemia yet.  Suggest taking a multivitamin w/iron daily

## 2023-10-17 NOTE — Assessment & Plan Note (Signed)
Chronic.  Controlled on supps.  Continue.  Pt on chronic PPI and vegetarian

## 2023-10-17 NOTE — Assessment & Plan Note (Signed)
Chronic.  Controlled.  Check tsh

## 2023-10-17 NOTE — Assessment & Plan Note (Signed)
Chronic.  Controlled w/prn albuterol now.  Will use flovent when season flares.

## 2023-10-17 NOTE — Assessment & Plan Note (Signed)
 Chronic.  Controlled on omeprazole 40mg  daily

## 2023-10-19 ENCOUNTER — Ambulatory Visit: Payer: BC Managed Care – PPO | Admitting: Family Medicine

## 2024-04-14 ENCOUNTER — Ambulatory Visit: Payer: BC Managed Care – PPO | Admitting: Family Medicine

## 2024-10-17 ENCOUNTER — Other Ambulatory Visit: Payer: Self-pay | Admitting: Family Medicine

## 2024-10-17 DIAGNOSIS — K219 Gastro-esophageal reflux disease without esophagitis: Secondary | ICD-10-CM

## 2024-10-17 NOTE — Telephone Encounter (Signed)
 Needs appt

## 2024-10-17 NOTE — Telephone Encounter (Signed)
 Lvm 10/17/2024 aw
